# Patient Record
Sex: Female | Born: 1980 | Race: Black or African American | Hispanic: No | Marital: Single | Smoking: Never smoker
Health system: Southern US, Community
[De-identification: ages and names within clinical notes are randomized; demographics above are authoritative.]

## PROBLEM LIST (undated history)

## (undated) DIAGNOSIS — R0602 Shortness of breath: Secondary | ICD-10-CM

## (undated) DIAGNOSIS — R7303 Prediabetes: Secondary | ICD-10-CM

## (undated) DIAGNOSIS — K649 Unspecified hemorrhoids: Secondary | ICD-10-CM

## (undated) DIAGNOSIS — F988 Other specified behavioral and emotional disorders with onset usually occurring in childhood and adolescence: Secondary | ICD-10-CM

## (undated) DIAGNOSIS — R12 Heartburn: Secondary | ICD-10-CM

## (undated) DIAGNOSIS — Z91018 Allergy to other foods: Secondary | ICD-10-CM

## (undated) DIAGNOSIS — F32A Depression, unspecified: Secondary | ICD-10-CM

## (undated) DIAGNOSIS — Z8619 Personal history of other infectious and parasitic diseases: Secondary | ICD-10-CM

## (undated) DIAGNOSIS — R41840 Attention and concentration deficit: Secondary | ICD-10-CM

## (undated) DIAGNOSIS — L309 Dermatitis, unspecified: Secondary | ICD-10-CM

## (undated) DIAGNOSIS — F909 Attention-deficit hyperactivity disorder, unspecified type: Secondary | ICD-10-CM

## (undated) DIAGNOSIS — G4730 Sleep apnea, unspecified: Secondary | ICD-10-CM

## (undated) DIAGNOSIS — T7840XA Allergy, unspecified, initial encounter: Secondary | ICD-10-CM

## (undated) DIAGNOSIS — E7800 Pure hypercholesterolemia, unspecified: Secondary | ICD-10-CM

## (undated) DIAGNOSIS — F50819 Binge eating disorder, unspecified: Secondary | ICD-10-CM

## (undated) DIAGNOSIS — R0683 Snoring: Secondary | ICD-10-CM

## (undated) DIAGNOSIS — E669 Obesity, unspecified: Secondary | ICD-10-CM

## (undated) DIAGNOSIS — R7309 Other abnormal glucose: Secondary | ICD-10-CM

## (undated) HISTORY — DX: Depression, unspecified: F32.A

## (undated) HISTORY — DX: Snoring: R06.83

## (undated) HISTORY — DX: Prediabetes: R73.03

## (undated) HISTORY — DX: Pure hypercholesterolemia, unspecified: E78.00

## (undated) HISTORY — DX: Shortness of breath: R06.02

## (undated) HISTORY — DX: Binge eating disorder, unspecified: F50.819

## (undated) HISTORY — DX: Allergy, unspecified, initial encounter: T78.40XA

## (undated) HISTORY — DX: Attention-deficit hyperactivity disorder, unspecified type: F90.9

## (undated) HISTORY — DX: Other specified behavioral and emotional disorders with onset usually occurring in childhood and adolescence: F98.8

## (undated) HISTORY — DX: Other abnormal glucose: R73.09

## (undated) HISTORY — DX: Personal history of other infectious and parasitic diseases: Z86.19

## (undated) HISTORY — PX: TOOTH EXTRACTION: SUR596

## (undated) HISTORY — DX: Obesity, unspecified: E66.9

## (undated) HISTORY — DX: Allergy to other foods: Z91.018

## (undated) HISTORY — DX: Attention and concentration deficit: R41.840

## (undated) HISTORY — DX: Sleep apnea, unspecified: G47.30

## (undated) HISTORY — DX: Heartburn: R12

---
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
## 2000-10-08 ENCOUNTER — Other Ambulatory Visit: Admission: RE | Admit: 2000-10-08 | Discharge: 2000-10-08 | Payer: Self-pay | Admitting: Obstetrics and Gynecology

## 2001-10-09 ENCOUNTER — Other Ambulatory Visit: Admission: RE | Admit: 2001-10-09 | Discharge: 2001-10-09 | Payer: Self-pay | Admitting: Obstetrics and Gynecology

## 2004-10-22 DIAGNOSIS — Z8619 Personal history of other infectious and parasitic diseases: Secondary | ICD-10-CM

## 2004-10-22 HISTORY — DX: Personal history of other infectious and parasitic diseases: Z86.19

## 2005-12-18 ENCOUNTER — Other Ambulatory Visit: Admission: RE | Admit: 2005-12-18 | Discharge: 2005-12-18 | Payer: Self-pay | Admitting: Obstetrics and Gynecology

## 2011-09-29 ENCOUNTER — Ambulatory Visit: Payer: Managed Care, Other (non HMO)

## 2011-09-29 DIAGNOSIS — R509 Fever, unspecified: Secondary | ICD-10-CM

## 2011-09-29 DIAGNOSIS — R05 Cough: Secondary | ICD-10-CM

## 2011-09-29 DIAGNOSIS — J111 Influenza due to unidentified influenza virus with other respiratory manifestations: Secondary | ICD-10-CM

## 2013-12-25 ENCOUNTER — Ambulatory Visit (INDEPENDENT_AMBULATORY_CARE_PROVIDER_SITE_OTHER): Payer: BC Managed Care – PPO | Admitting: Family Medicine

## 2013-12-25 ENCOUNTER — Encounter: Payer: Self-pay | Admitting: Family Medicine

## 2013-12-25 VITALS — BP 128/62 | HR 75 | Temp 98.0°F | Ht 62.0 in | Wt 278.5 lb

## 2013-12-25 DIAGNOSIS — R7303 Prediabetes: Secondary | ICD-10-CM | POA: Insufficient documentation

## 2013-12-25 DIAGNOSIS — E669 Obesity, unspecified: Secondary | ICD-10-CM | POA: Insufficient documentation

## 2013-12-25 DIAGNOSIS — E119 Type 2 diabetes mellitus without complications: Secondary | ICD-10-CM

## 2013-12-25 LAB — COMPREHENSIVE METABOLIC PANEL
ALBUMIN: 3.6 g/dL (ref 3.5–5.2)
ALT: 13 U/L (ref 0–35)
AST: 15 U/L (ref 0–37)
Alkaline Phosphatase: 55 U/L (ref 39–117)
BUN: 9 mg/dL (ref 6–23)
CALCIUM: 9.3 mg/dL (ref 8.4–10.5)
CO2: 27 meq/L (ref 19–32)
Chloride: 106 mEq/L (ref 96–112)
Creatinine, Ser: 0.7 mg/dL (ref 0.4–1.2)
GFR: 115.62 mL/min (ref >60.00)
GLUCOSE: 80 mg/dL (ref 70–99)
POTASSIUM: 3.7 meq/L (ref 3.5–5.1)
SODIUM: 139 meq/L (ref 135–145)
TOTAL PROTEIN: 8 g/dL (ref 6.0–8.3)
Total Bilirubin: 0.5 mg/dL (ref 0.3–1.2)

## 2013-12-25 LAB — LIPID PANEL
Cholesterol: 220 mg/dL — ABNORMAL HIGH (ref 0–200)
HDL: 38.5 mg/dL — ABNORMAL LOW (ref >39.00)
LDL Cholesterol: 163 mg/dL — ABNORMAL HIGH (ref 0–99)
Total CHOL/HDL Ratio: 6
Triglycerides: 91 mg/dL (ref 0.0–149.0)
VLDL: 18.2 mg/dL (ref 0.0–40.0)

## 2013-12-25 LAB — HEMOGLOBIN A1C: HEMOGLOBIN A1C: 5.7 % (ref 4.6–6.5)

## 2013-12-25 NOTE — Progress Notes (Signed)
Pre visit review using our clinic review tool, if applicable. No additional management support is needed unless otherwise documented below in the visit note. 

## 2013-12-25 NOTE — Progress Notes (Signed)
BP 128/62  Pulse 75  Temp(Src) 98 F (36.7 C) (Oral)  Ht 5\' 2"  (1.575 m)  Wt 278 lb 8 oz (126.327 kg)  BMI 50.93 kg/m2  SpO2 98%  LMP 12/25/2013   CC: new pt to establish  Subjective:    Patient ID: Meghan Griffin, female    DOB: [birth year], 33 y.o.   MRN: [id number]  HPI: Meghan Griffin is a 33 y.o. female presenting on 12/25/2013 with Establish Care and disuss labs   Meghan Griffin presents today with her boyfriend to establish care.  Dr. Henderson Cloud OBGYN checked A1c 01/2013 and it was 7.0%.  Obesity - weight loss noted.  Using nutri-bullet.  15 lbs in last 3 months.   Wt Readings from Last 3 Encounters:  12/25/13 278 lb 8 oz (126.327 kg)    DM - regularly does not check sugars.  Denies hypoglycemic symptoms.  Some paresthesias of hands and feet. Last eye exam 04/2013.   She has had thyroid checked in past.  Preventative: Well woman exam - 01/2013 with Henderson Cloud. Normal pap LMP 12/25/2013  Lives with boyfriend, 1 dog Occupation: Fish farm manager Edu: [employer] Activity: no regular exercise Diet: good water, fruits/vegetables daily  Relevant past medical, surgical, family and social history reviewed and updated as indicated.  Allergies and medications reviewed and updated. No current outpatient prescriptions on file prior to visit.   No current facility-administered medications on file prior to visit.    Review of Systems  Constitutional: Negative for fever, chills, activity change, appetite change, fatigue and unexpected weight change.  HENT: Negative for hearing loss.   Eyes: Negative for visual disturbance.  Respiratory: Negative for cough, chest tightness, shortness of breath and wheezing.   Cardiovascular: Negative for chest pain, palpitations and leg swelling.  Gastrointestinal: Negative for nausea, vomiting, abdominal pain, diarrhea, constipation, blood in stool and abdominal distention.  Genitourinary: Negative for hematuria and difficulty urinating.  Musculoskeletal:  Negative for arthralgias, myalgias and neck pain.  Skin: Negative for rash.  Neurological: Negative for dizziness, seizures, syncope and headaches.  Hematological: Negative for adenopathy. Does not bruise/bleed easily.  Psychiatric/Behavioral: Negative for dysphoric mood. The patient is not nervous/anxious.    Per HPI unless specifically indicated above    Objective:    BP 128/62  Pulse 75  Temp(Src) 98 F (36.7 C) (Oral)  Ht 5\' 2"  (1.575 m)  Wt 278 lb 8 oz (126.327 kg)  BMI 50.93 kg/m2  SpO2 98%  LMP 12/25/2013  Physical Exam  Nursing note and vitals reviewed. Constitutional: She is oriented to person, place, and time. She appears well-developed and well-nourished. No distress.  HENT:  Head: Normocephalic and atraumatic.  Right Ear: Hearing, tympanic membrane, external ear and ear canal normal.  Left Ear: Hearing, tympanic membrane, external ear and ear canal normal.  Nose: Nose normal.  Mouth/Throat: Uvula is midline, oropharynx is clear and moist and mucous membranes are normal. No oropharyngeal exudate, posterior oropharyngeal edema or posterior oropharyngeal erythema.  Eyes: Conjunctivae and EOM are normal. Pupils are equal, round, and reactive to light. No scleral icterus.  Neck: Normal range of motion. Neck supple. No thyromegaly present.  Cardiovascular: Normal rate, regular rhythm, normal heart sounds and intact distal pulses.   No murmur heard. Pulses:      Radial pulses are 2+ on the right side, and 2+ on the left side.  Pulmonary/Chest: Effort normal and breath sounds normal. No respiratory distress. She has no wheezes. She has no rales.  Musculoskeletal: Normal range  of motion. She exhibits no edema.  Diabetic foot exam done  Lymphadenopathy:    She has no cervical adenopathy.  Neurological: She is alert and oriented to person, place, and time.  CN grossly intact, station and gait intact  Skin: Skin is warm and dry. No rash noted.  Hyperpigmented macule R neck -  birthmark  Psychiatric: She has a normal mood and affect. Her behavior is normal. Judgment and thought content normal.   No results found for this or any previous visit.    Assessment & Plan:   Problem List Items Addressed This Visit   Obesity     Chronic issue. Discussed weight loss strategies. Encouraged slowly incorporate activity into routine, with goal of 150min mod intensity aerobic exercise weekly. Pt working on healthy diet changes.    T2DM (type 2 diabetes mellitus) - Primary     Pathophysiology of disease discussed as well as treatment options. Discussed relation of diabetes and sugar control to weight. Check labwork today - if persistently A1c >6.8, will start metformin and refer for DMSE. Will also send in glucose meter. Pt agrees with plan. DM handout provided as well as ADA website.    Relevant Orders      Hemoglobin A1c      Lipid panel      Comprehensive metabolic panel      [facility] DIABETES FOOT EXAM (Completed)       Follow up plan: Return in about 3 months (around 03/27/2014), or as needed, for follow up.

## 2013-12-25 NOTE — Assessment & Plan Note (Signed)
Chronic issue. Discussed weight loss strategies. Encouraged slowly incorporate activity into routine, with goal of mod intensity aerobic exercise weekly. Pt working on healthy diet changes.

## 2013-12-25 NOTE — Assessment & Plan Note (Addendum)
Pathophysiology of disease discussed as well as treatment options. Discussed relation of diabetes and sugar control to weight. Check labwork today - if persistently A1c >6.8, will start metformin and refer for DMSE. Will also send in glucose meter. Pt agrees with plan. DM handout provided as well as ADA website.

## 2013-12-25 NOTE — Patient Instructions (Signed)
We will recheck labwork today. If sugar staying in diabetes range, we will refer you to diabetes education classes and start metformin.  I will also send a glucose meter to your pharmacy. Good to meet you today, call us with questions. Return to see me in 3 months for follow up. Congratulation on the weight! Keep it up!  Work on increased activity. Look into www.diabetes.org for more information.  Type 2 Diabetes Mellitus, Adult Type 2 diabetes mellitus, often simply referred to as type 2 diabetes, is a long-lasting (chronic) disease. In type 2 diabetes, the pancreas does not make enough insulin (a hormone), the cells are less responsive to the insulin that is made (insulin resistance), or both. Normally, insulin moves sugars from food into the tissue cells. The tissue cells use the sugars for energy. The lack of insulin or the lack of normal response to insulin causes excess sugars to build up in the blood instead of going into the tissue cells. As a result, high blood sugar (hyperglycemia) develops. The effect of high sugar (glucose) levels can cause many complications. Type 2 diabetes was also previously called adult-onset diabetes but it can occur at any age.  RISK FACTORS  A person is predisposed to developing type 2 diabetes if someone in the family has the disease and also has one or more of the following primary risk factors:  Overweight.  An inactive lifestyle.  A history of consistently eating high-calorie foods. Maintaining a normal weight and regular physical activity can reduce the chance of developing type 2 diabetes. SYMPTOMS  A person with type 2 diabetes may not show symptoms initially. The symptoms of type 2 diabetes appear slowly. The symptoms include:  Increased thirst (polydipsia).  Increased urination (polyuria).  Increased urination during the night (nocturia).  Weight loss. This weight loss may be rapid.  Frequent, recurring infections.  Tiredness  (fatigue).  Weakness.  Vision changes, such as blurred vision.  Fruity smell to your breath.  Abdominal pain.  Nausea or vomiting.  Cuts or bruises which are slow to heal.  Tingling or numbness in the hands or feet. DIAGNOSIS Type 2 diabetes is frequently not diagnosed until complications of diabetes are present. Type 2 diabetes is diagnosed when symptoms or complications are present and when blood glucose levels are increased. Your blood glucose level may be checked by one or more of the following blood tests:  A fasting blood glucose test. You will not be allowed to eat for at least 8 hours before a blood sample is taken.  A random blood glucose test. Your blood glucose is checked at any time of the day regardless of when you ate.  A hemoglobin A1c blood glucose test. A hemoglobin A1c test provides information about blood glucose control over the previous 3 months.  An oral glucose tolerance test (OGTT). Your blood glucose is measured after you have not eaten (fasted) for 2 hours and then after you drink a glucose-containing beverage. TREATMENT   You may need to take insulin or diabetes medicine daily to keep blood glucose levels in the desired range.  You will need to match insulin dosing with exercise and healthy food choices. The treatment goal is to maintain the before meal blood sugar (preprandial glucose) level at 70 130 mg/dL. HOME CARE INSTRUCTIONS   Have your hemoglobin A1c level checked twice a year.  Perform daily blood glucose monitoring as directed by your caregiver.  Monitor urine ketones when you are ill and as directed by your caregiver.  Take your diabetes medicine or insulin as directed by your caregiver to maintain your blood glucose levels in the desired range.  Never run out of diabetes medicine or insulin. It is needed every day.  Adjust insulin based on your intake of carbohydrates. Carbohydrates can raise blood glucose levels but need to be  included in your diet. Carbohydrates provide vitamins, minerals, and fiber which are an essential part of a healthy diet. Carbohydrates are found in fruits, vegetables, whole grains, dairy products, legumes, and foods containing added sugars.    Eat healthy foods. Alternate 3 meals with 3 snacks.  Lose weight if overweight.  Carry a medical alert card or wear your medical alert jewelry.  Carry a 15 gram carbohydrate snack with you at all times to treat low blood glucose (hypoglycemia). Some examples of 15 gram carbohydrate snacks include:  Glucose tablets, 3 or 4   Glucose gel, 15 gram tube  Raisins, 2 tablespoons (24 grams)  Jelly beans, 6  Animal crackers, 8  Regular pop, 4 ounces (120 mL)  Gummy treats, 9  Recognize hypoglycemia. Hypoglycemia occurs with blood glucose levels of 70 mg/dL and below. The risk for hypoglycemia increases when fasting or skipping meals, during or after intense exercise, and during sleep. Hypoglycemia symptoms can include:  Tremors or shakes.  Decreased ability to concentrate.  Sweating.  Increased heart rate.  Headache.  Dry mouth.  Hunger.  Irritability.  Anxiety.  Restless sleep.  Altered speech or coordination.  Confusion.  Treat hypoglycemia promptly. If you are alert and able to safely swallow, follow the 15:15 rule:  Take 15 20 grams of rapid-acting glucose or carbohydrate. Rapid-acting options include glucose gel, glucose tablets, or 4 ounces (120 mL) of fruit juice, regular soda, or low fat milk.  Check your blood glucose level 15 minutes after taking the glucose.  Take 15 20 grams more of glucose if the repeat blood glucose level is still 70 mg/dL or below.  Eat a meal or snack within 1 hour once blood glucose levels return to normal.    Be alert to polyuria and polydipsia which are early signs of hyperglycemia. An early awareness of hyperglycemia allows for prompt treatment. Treat hyperglycemia as directed by  your caregiver.  Engage in at least 150 minutes of moderate-intensity physical activity a week, spread over at least 3 days of the week or as directed by your caregiver. In addition, you should engage in resistance exercise at least 2 times a week or as directed by your caregiver.  Adjust your medicine and food intake as needed if you start a new exercise or sport.  Follow your sick day plan at any time you are unable to eat or drink as usual.  Avoid tobacco use.  Limit alcohol intake to no more than 1 drink per day for nonpregnant women and 2 drinks per day for men. You should drink alcohol only when you are also eating food. Talk with your caregiver whether alcohol is safe for you. Tell your caregiver if you drink alcohol several times a week.  Follow up with your caregiver regularly.  Schedule an eye exam soon after the diagnosis of type 2 diabetes and then annually.  Perform daily skin and foot care. Examine your skin and feet daily for cuts, bruises, redness, nail problems, bleeding, blisters, or sores. A foot exam by a caregiver should be done annually.  Brush your teeth and gums at least twice a day and floss at least once a day. Follow up  with your dentist regularly.  Share your diabetes management plan with your workplace or school.  Stay up-to-date with immunizations.  Learn to manage stress.  Obtain ongoing diabetes education and support as needed.  Participate in, or seek rehabilitation as needed to maintain or improve independence and quality of life. Request a physical or occupational therapy referral if you are having foot or hand numbness or difficulties with grooming, dressing, eating, or physical activity. SEEK MEDICAL CARE IF:   You are unable to eat food or drink fluids for more than 6 hours.  You have nausea and vomiting for more than 6 hours.  Your blood glucose level is over 240 mg/dL.  There is a change in mental status.  You develop an additional  serious illness.  You have diarrhea for more than 6 hours.  You have been sick or have had a fever for a couple of days and are not getting better.  You have pain during any physical activity.  SEEK IMMEDIATE MEDICAL CARE IF:  You have difficulty breathing.  You have moderate to large ketone levels. MAKE SURE YOU:  Understand these instructions.  Will watch your condition.  Will get help right away if you are not doing well or get worse. Document Released: 10/08/2005 Document Revised: 07/02/2012 Document Reviewed: 05/06/2012 [facility] Patient Information 2014 [place], [place].

## 2013-12-26 ENCOUNTER — Encounter: Payer: Self-pay | Admitting: Family Medicine

## 2013-12-28 ENCOUNTER — Encounter: Payer: Self-pay | Admitting: *Deleted

## 2013-12-29 ENCOUNTER — Telehealth: Payer: Self-pay

## 2013-12-29 NOTE — Telephone Encounter (Signed)
Relevant patient education assigned to patient using Emmi. ° °

## 2014-06-01 LAB — OB RESULTS CONSOLE GC/CHLAMYDIA
CHLAMYDIA, DNA PROBE: NEGATIVE
GC PROBE AMP, GENITAL: NEGATIVE

## 2014-06-01 LAB — OB RESULTS CONSOLE HEPATITIS B SURFACE ANTIGEN: Hepatitis B Surface Ag: NEGATIVE

## 2014-06-01 LAB — OB RESULTS CONSOLE ANTIBODY SCREEN: ANTIBODY SCREEN: NEGATIVE

## 2014-06-01 LAB — OB RESULTS CONSOLE ABO/RH: RH Type: POSITIVE

## 2014-06-01 LAB — OB RESULTS CONSOLE HIV ANTIBODY (ROUTINE TESTING): HIV: NONREACTIVE

## 2014-06-01 LAB — OB RESULTS CONSOLE RUBELLA ANTIBODY, IGM: Rubella: IMMUNE

## 2014-07-21 ENCOUNTER — Other Ambulatory Visit (HOSPITAL_COMMUNITY): Payer: Self-pay | Admitting: Obstetrics and Gynecology

## 2014-07-21 DIAGNOSIS — Z3689 Encounter for other specified antenatal screening: Secondary | ICD-10-CM

## 2014-07-28 ENCOUNTER — Ambulatory Visit (HOSPITAL_COMMUNITY): Payer: BC Managed Care – PPO

## 2014-08-06 ENCOUNTER — Ambulatory Visit (HOSPITAL_COMMUNITY)
Admission: RE | Admit: 2014-08-06 | Discharge: 2014-08-06 | Disposition: A | Discharge status: Home / Self Care | Payer: BC Managed Care – PPO | Source: Ambulatory Visit | Attending: Obstetrics and Gynecology | Admitting: Obstetrics and Gynecology

## 2014-08-06 DIAGNOSIS — Z36 Encounter for antenatal screening of mother: Secondary | ICD-10-CM | POA: Insufficient documentation

## 2014-08-06 DIAGNOSIS — O99212 Obesity complicating pregnancy, second trimester: Secondary | ICD-10-CM | POA: Diagnosis present

## 2014-08-06 DIAGNOSIS — Z3689 Encounter for other specified antenatal screening: Secondary | ICD-10-CM

## 2014-08-06 DIAGNOSIS — Z3A22 22 weeks gestation of pregnancy: Secondary | ICD-10-CM | POA: Diagnosis not present

## 2014-08-26 ENCOUNTER — Other Ambulatory Visit (HOSPITAL_COMMUNITY): Payer: Self-pay | Admitting: Obstetrics and Gynecology

## 2014-08-26 DIAGNOSIS — O99213 Obesity complicating pregnancy, third trimester: Secondary | ICD-10-CM

## 2014-08-26 DIAGNOSIS — Z3A29 29 weeks gestation of pregnancy: Secondary | ICD-10-CM

## 2014-09-20 LAB — OB RESULTS CONSOLE RPR: RPR: NONREACTIVE

## 2014-09-22 ENCOUNTER — Ambulatory Visit (HOSPITAL_COMMUNITY): Payer: BC Managed Care – PPO

## 2014-09-24 ENCOUNTER — Ambulatory Visit (HOSPITAL_COMMUNITY)
Admission: RE | Admit: 2014-09-24 | Discharge: 2014-09-24 | Disposition: A | Discharge status: Home / Self Care | Payer: BC Managed Care – PPO | Source: Ambulatory Visit | Attending: Obstetrics and Gynecology | Admitting: Obstetrics and Gynecology

## 2014-09-24 ENCOUNTER — Encounter (HOSPITAL_COMMUNITY): Payer: Self-pay | Admitting: Obstetrics and Gynecology

## 2014-09-24 ENCOUNTER — Other Ambulatory Visit (HOSPITAL_COMMUNITY): Payer: Self-pay | Admitting: Obstetrics and Gynecology

## 2014-09-24 ENCOUNTER — Encounter (HOSPITAL_COMMUNITY): Payer: Self-pay

## 2014-09-24 DIAGNOSIS — Z36 Encounter for antenatal screening of mother: Secondary | ICD-10-CM | POA: Insufficient documentation

## 2014-09-24 DIAGNOSIS — O99213 Obesity complicating pregnancy, third trimester: Secondary | ICD-10-CM

## 2014-09-24 DIAGNOSIS — Z3689 Encounter for other specified antenatal screening: Secondary | ICD-10-CM | POA: Insufficient documentation

## 2014-09-24 DIAGNOSIS — Z3A29 29 weeks gestation of pregnancy: Secondary | ICD-10-CM | POA: Insufficient documentation

## 2014-09-24 DIAGNOSIS — O99210 Obesity complicating pregnancy, unspecified trimester: Secondary | ICD-10-CM | POA: Insufficient documentation

## 2014-09-24 HISTORY — DX: Dermatitis, unspecified: L30.9

## 2014-09-24 HISTORY — DX: Unspecified hemorrhoids: K64.9

## 2014-09-24 LAB — US OB FOLLOW UP

## 2014-10-05 ENCOUNTER — Other Ambulatory Visit (HOSPITAL_COMMUNITY): Payer: Self-pay

## 2014-10-22 NOTE — L&D Delivery Note (Signed)
Cesarean Section Procedure Note  Indications: 34 yo G1P0 at 38 weeks 5 days with Non-reassuring fetal heart tracing Cat III  Pre-operative Diagnosis: Non-Reassuring Fetal Heart Tracing  Post-operative Diagnosis: same  Surgeon: Crit Obremski [name]   Assistants: Dr. Leggett  Anesthesia: Epidural anesthesia  ASA Class: 2  Procedure Details   The patient was counseled about the risks, benefits, complications of the cesarean section using an interpreter. The patient concurred with the proposed plan, giving informed consent.  The site of surgery properly noted/marked. The patient was taken to Operating Room # 1, identified as Lenox Junkin and the procedure verified as C-Section Delivery. A Time Out was held and the above information confirmed.  After epidural anesthesia was found to be adequate, the patient was placed in the dorsal supine position with a leftward tilt, the fetal heart beat was found in the 80's 90's. The abdomen was prepped and draped. A Pfannenstiel incision was made and carried down through the subcutaneous tissue to the fascia.  The fascia was incised in the midline and the fascial incision was opened and extended laterally followed by opening the abdominal peritoneum bluntly using surgeons fingers. The Alexis retractor was then deployed. The scalpel was then used to make a low transverse incision on the uterus which was extended laterally with  blunt dissection. The fetal vertex was identified, and the head delivered with assistance with pushing on the uterine fundus.  The head was then delivered easily through the uterine incision followed by the body. The A live female infant was bulb suctioned on the operative field cried vigorously, cord was clamped and cut and the infant was passed to the waiting neonatologist. Apgars 8/9. Placenta was then delivered spontaneously, intact and appeared to possibly have a fundal abruption, the uterus was cleared of all clot and debris. The  uterine incision was repaired with #0 Monocryl in running locked fashion. A second imbricating suture was performed using the same suture. The incision was hemostatic. Ovaries and tubes were inspected and normal. The Alexis retractor was removed. The abdominal cavity was cleared of all clot and debris. The abdominal peritoneum was reapproximated with 2-0 chromic  in a running fashion, the rectus muscles was reapproximated with #2 chromic in interrupted fashion. The fascia was closed with 0 Vicryl in a running fashion. The subcuticular layer was irrigated and all bleeders cauterized.  The subcutaneous layer was re-approximated with 2-0 plain gut.   The skin was closed with 4-0 vicryl in a subcuticular fashion using a Keith needle. The incision was dressed with benzoine, steri strips and pressure dressing. All sponge lap and needle counts were correct x3. Patient tolerated the procedure well and recovered in stable condition following the procedure.  Instrument, sponge, and needle counts were correct prior the abdominal closure and at the conclusion of the case.   Findings: Live female infant, Apgars 8/9, clear amniotic fluid, normal appearing placenta, normal uterus, bilateral tubes and ovaries  Estimated Blood Loss: 750 mL         Drains: Foley catheter         Specimens: Placenta to L&D         Implants: none         Complications:  None; patient tolerated the procedure well.         Disposition: PACU - hemodynamically stable.   Rosina Cressler [name]  

## 2014-10-29 ENCOUNTER — Ambulatory Visit (HOSPITAL_COMMUNITY)
Admission: RE | Admit: 2014-10-29 | Discharge: 2014-10-29 | Disposition: A | Discharge status: Home / Self Care | Payer: BLUE CROSS/BLUE SHIELD | Source: Ambulatory Visit | Attending: Obstetrics and Gynecology | Admitting: Obstetrics and Gynecology

## 2014-10-29 ENCOUNTER — Encounter (HOSPITAL_COMMUNITY): Payer: Self-pay

## 2014-10-29 DIAGNOSIS — E669 Obesity, unspecified: Secondary | ICD-10-CM | POA: Diagnosis not present

## 2014-10-29 DIAGNOSIS — O99213 Obesity complicating pregnancy, third trimester: Secondary | ICD-10-CM | POA: Insufficient documentation

## 2014-11-10 LAB — OB RESULTS CONSOLE GBS: GBS: POSITIVE

## 2014-11-28 ENCOUNTER — Inpatient Hospital Stay (HOSPITAL_COMMUNITY)
Admission: AD | Admit: 2014-11-28 | Discharge: 2014-11-28 | Disposition: A | Discharge status: Home / Self Care | Payer: BLUE CROSS/BLUE SHIELD | Source: Ambulatory Visit | Attending: Obstetrics | Admitting: Obstetrics

## 2014-11-28 ENCOUNTER — Encounter (HOSPITAL_COMMUNITY): Payer: Self-pay | Admitting: *Deleted

## 2014-11-28 DIAGNOSIS — O4693 Antepartum hemorrhage, unspecified, third trimester: Secondary | ICD-10-CM | POA: Insufficient documentation

## 2014-11-28 DIAGNOSIS — O2203 Varicose veins of lower extremity in pregnancy, third trimester: Secondary | ICD-10-CM | POA: Diagnosis not present

## 2014-11-28 DIAGNOSIS — Z3A38 38 weeks gestation of pregnancy: Secondary | ICD-10-CM | POA: Diagnosis not present

## 2014-11-28 DIAGNOSIS — O2200 Varicose veins of lower extremity in pregnancy, unspecified trimester: Secondary | ICD-10-CM | POA: Diagnosis not present

## 2014-11-28 LAB — URINE MICROSCOPIC-ADD ON

## 2014-11-28 LAB — URINALYSIS, ROUTINE W REFLEX MICROSCOPIC
Bilirubin Urine: NEGATIVE
GLUCOSE, UA: NEGATIVE mg/dL
KETONES UR: NEGATIVE mg/dL
Leukocytes, UA: NEGATIVE
NITRITE: NEGATIVE
PH: 5.5 (ref 5.0–8.0)
Protein, ur: NEGATIVE mg/dL
Specific Gravity, Urine: 1.01 (ref 1.005–1.030)
Urobilinogen, UA: 0.2 mg/dL (ref 0.0–1.0)

## 2014-11-28 MED ORDER — FERRIC SUBSULFATE 259 MG/GM EX SOLN
Freq: Once | CUTANEOUS | Status: AC
Start: 2014-11-28 — End: 2014-11-28
  Administered 2014-11-28: 1 via TOPICAL
  Filled 2014-11-28: qty 8

## 2014-11-28 NOTE — MAU Note (Signed)
Went to [facility] ago and saw bright red on tissue when wiped. Few small clots. No pain. Baby moving.

## 2014-11-28 NOTE — MAU Provider Note (Signed)
History     CSN: [id number]  Arrival date and time: 11/28/14 0038   None     Chief Complaint  Patient presents with  . Vaginal Bleeding   HPI Comments: Meghan Griffin 34 y.o. G1P0 [redacted]w[redacted]d presents to MAU with vaginal bleeding that she noticed in shower. The blood ran down her legs. She denies any pain, LOF, Contractions. She has noticed swelling in feet and hands. She has an appointment on Tuesday  Vaginal Bleeding      Past Medical History  Diagnosis Date  . History of gonorrhea 2006    treated  . Obesity   . Eczema   . Hemorrhoids     Past Surgical History  Procedure Laterality Date  . Tooth extraction      Family History  Problem Relation Age of Onset  . Alcohol abuse Father   . COPD Father   . Hyperlipidemia Father   . CAD Father 84    MI  . Stroke Father 107  . Hypertension Father   . Diabetes Father   . Hyperlipidemia Maternal Grandmother   . Hypertension Maternal Grandmother   . Hyperlipidemia Maternal Grandfather   . Hypertension Maternal Grandfather   . Cancer Other     great grandmother - bone marrow  . Cancer Paternal Aunt     leukemia    History  Substance Use Topics  . Smoking status: Never Smoker   . Smokeless tobacco: Never Used  . Alcohol Use: No     Comment: occasional    Allergies:  Allergies  Allergen Reactions  . Amoxicillin Diarrhea    Prescriptions prior to admission  Medication Sig Dispense Refill Last Dose  . ibuprofen (ADVIL,MOTRIN) 800 MG tablet Take 800 mg by mouth every 8 (eight) hours as needed.   Not Taking  . meclizine (ANTIVERT) 25 MG tablet Take 25 mg by mouth as needed for dizziness.   Not Taking  . Multiple Vitamin (MULTIVITAMIN) tablet Take 1 tablet by mouth daily.   Not Taking  . norgestimate-ethinyl estradiol (ORTHO-CYCLEN,SPRINTEC,PREVIFEM) 0.25-35 MG-MCG tablet Take 1 tablet by mouth daily.   Not Taking  . nystatin (MYCOSTATIN/NYSTOP) 100000 UNIT/GM POWD Apply topically 2 (two) times daily as needed.    Not Taking  . Prenatal Vit-Fe Fumarate-FA (PRENATAL VITAMIN PO) Take by mouth.   Taking    Review of Systems  Constitutional: Negative.   HENT: Negative.   Eyes: Negative.   Respiratory: Negative.   Cardiovascular: Positive for leg swelling.  Gastrointestinal: Negative.   Genitourinary:       Vaginal bleeding  Skin: Negative.   Neurological: Negative.   Psychiatric/Behavioral: Negative.    Physical Exam   Blood pressure 136/70, pulse 98, temperature 98.5 F (36.9 C), temperature source Oral, resp. rate 20, height  (1.575 m), weight 128.005 kg (282 lb 3.2 oz), last menstrual period 02/23/2014.  Physical Exam  Constitutional: She is oriented to person, place, and time. She appears well-developed and well-nourished. No distress.  obese  HENT:  Head: Normocephalic and atraumatic.  Eyes: Pupils are equal, round, and reactive to light.  GI: Soft. She exhibits no distension. There is no tenderness. There is no rebound and no guarding.  Genitourinary:  Genital:externally there is blood. After cleaning she has a small varicose vein that has ruptured. Monsels solution used to stop bleeeding Vaginal: No Blood/ small amount white discharge Cervix: closed/ thick    Musculoskeletal: She exhibits edema.  +3 edema in legs  Neurological: She is alert and oriented  to person, place, and time.  Skin: Skin is warm and dry.  Psychiatric: She has a normal mood and affect. Her behavior is normal. Judgment and thought content normal.   TOCO: rate 130 reassuring/ no contractions  Results for orders placed or performed during the hospital encounter of 11/28/14 (from the past 24 hour(s))  Urinalysis, Routine w reflex microscopic     Status: Abnormal   Collection Time: 11/28/14  1:00 AM  Result Value Ref Range   Color, Urine YELLOW YELLOW   APPearance CLEAR CLEAR   Specific Gravity, Urine 1.010 1.005 - 1.030   pH 5.5 5.0 - 8.0   Glucose, UA NEGATIVE NEGATIVE mg/dL   Hgb urine dipstick  LARGE (A) NEGATIVE   Bilirubin Urine NEGATIVE NEGATIVE   Ketones, ur NEGATIVE NEGATIVE mg/dL   Protein, ur NEGATIVE NEGATIVE mg/dL   Urobilinogen, UA 0.2 0.0 - 1.0 mg/dL   Nitrite NEGATIVE NEGATIVE   Leukocytes, UA NEGATIVE NEGATIVE  Urine microscopic-add on     Status: Abnormal   Collection Time: 11/28/14  1:00 AM  Result Value Ref Range   Squamous Epithelial / LPF RARE RARE   WBC, UA 0-2 <3 WBC/hpf   RBC / HPF 21-50 <3 RBC/hpf   Bacteria, UA FEW (A) RARE     MAU Course  Procedures  MDM Spoke with Dr Chestine Sporelark who advised it was ok to send her home BP was elevated on initial contact due to size small cuff/ after large cuff used BP was normal ranges Assessment and Plan   A: Varicose veins in pregnancy  P: Advised to purchase maternity belt, sit as often as possible Reassurance/ Follow with Dr Henderson CloudHorvath/ return to MAU as needed  Meghan Griffin, Meghan Griffin 11/28/2014, 2:17 AM

## 2014-11-28 NOTE — MAU Note (Signed)
Patient reports seeing bright red bleeding after wiping at 11:30pm. Denies intercourse. Patient reports bright red bleeding continued, noticed blood running down her leg in the shower. Patient reports normal fetal movement, no LOF noted by patient. Denies UCs.

## 2014-11-28 NOTE — Discharge Instructions (Signed)
Varicose Veins Varicose veins are veins that have become enlarged and twisted. CAUSES This condition is the result of valves in the veins not working properly. Valves in the veins help return blood from the leg to the heart. If these valves are damaged, blood flows backwards and backs up into the veins in the leg near the skin. This causes the veins to become larger. People who are on their feet a lot, who are pregnant, or who are overweight are more likely to develop varicose veins. SYMPTOMS   Bulging, twisted-appearing, bluish veins, most commonly found on the legs.  Leg pain or a feeling of heaviness. These symptoms may be worse at the end of the day.  Leg swelling.  Skin color changes. DIAGNOSIS  Varicose veins can usually be diagnosed with an exam of your legs by your caregiver. He or she may recommend an ultrasound of your leg veins. TREATMENT  Most varicose veins can be treated at home.However, other treatments are available for people who have persistent symptoms or who want to treat the cosmetic appearance of the varicose veins. These include:  Laser treatment of very small varicose veins.  Medicine that is shot (injected) into the vein. This medicine hardens the walls of the vein and closes off the vein. This treatment is called sclerotherapy. Afterwards, you may need to wear clothing or bandages that apply pressure.  Surgery. HOME CARE INSTRUCTIONS   Do not stand or sit in one position for long periods of time. Do not sit with your legs crossed. Rest with your legs raised during the day.  Wear elastic stockings or support hose. Do not wear other tight, encircling garments around the legs, pelvis, or waist.  Walk as much as possible to increase blood flow.  Raise the foot of your bed at night with 2-inch blocks.  If you get a cut in the skin over the vein and the vein bleeds, lie down with your leg raised and press on it with a clean cloth until the bleeding stops. Then  place a bandage (dressing) on the cut. See your caregiver if it continues to bleed or needs stitches. SEEK MEDICAL CARE IF:   The skin around your ankle starts to break down.  You have pain, redness, tenderness, or hard swelling developing in your leg over a vein.  You are uncomfortable due to leg pain. Document Released: 07/18/2005 Document Revised: 12/31/2011 Document Reviewed: 12/04/2010 [facility] Patient Information 2015 [place], [place]. This information is not intended to replace advice given to you by your health care provider. Make sure you discuss any questions you have with your health care provider. Third Trimester of Pregnancy The third trimester is from week 29 through week 42, months 7 through 9. The third trimester is a time when the fetus is growing rapidly. At the end of the ninth month, the fetus is about 20 inches in length and weighs 6-10 pounds.  BODY CHANGES Your body goes through many changes during pregnancy. The changes vary from woman to woman.   Your weight will continue to increase. You can expect to gain 25-35 pounds (11-16 kg) by the end of the pregnancy.  You may begin to get stretch marks on your hips, abdomen, and breasts.  You may urinate more often because the fetus is moving lower into your pelvis and pressing on your bladder.  You may develop or continue to have heartburn as a result of your pregnancy.  You may develop constipation because certain hormones are causing the  muscles that push waste through your intestines to slow down.  You may develop hemorrhoids or swollen, bulging veins (varicose veins).  You may have pelvic pain because of the weight gain and pregnancy hormones relaxing your joints between the bones in your pelvis. Backaches may result from overexertion of the muscles supporting your posture.  You may have changes in your hair. These can include thickening of your hair, rapid growth, and changes in texture. Some women also have hair  loss during or after pregnancy, or hair that feels dry or thin. Your hair will most likely return to normal after your baby is born.  Your breasts will continue to grow and be tender. A yellow discharge may leak from your breasts called colostrum.  Your belly button may stick out.  You may feel short of breath because of your expanding uterus.  You may notice the fetus "dropping," or moving lower in your abdomen.  You may have a bloody mucus discharge. This usually occurs a few days to a week before labor begins.  Your cervix becomes thin and soft (effaced) near your due date. WHAT TO EXPECT AT YOUR PRENATAL EXAMS  You will have prenatal exams every 2 weeks until week 36. Then, you will have weekly prenatal exams. During a routine prenatal visit:  You will be weighed to make sure you and the fetus are growing normally.  Your blood pressure is taken.  Your abdomen will be measured to track your baby's growth.  The fetal heartbeat will be listened to.  Any test results from the previous visit will be discussed.  You may have a cervical check near your due date to see if you have effaced. At around 36 weeks, your caregiver will check your cervix. At the same time, your caregiver will also perform a test on the secretions of the vaginal tissue. This test is to determine if a type of bacteria, Group B streptococcus, is present. Your caregiver will explain this further. Your caregiver may ask you:  What your birth plan is.  How you are feeling.  If you are feeling the baby move.  If you have had any abnormal symptoms, such as leaking fluid, bleeding, severe headaches, or abdominal cramping.  If you have any questions. Other tests or screenings that may be performed during your third trimester include:  Blood tests that check for low iron levels (anemia).  Fetal testing to check the health, activity level, and growth of the fetus. Testing is done if you have certain medical  conditions or if there are problems during the pregnancy. FALSE LABOR You may feel small, irregular contractions that eventually go away. These are called Braxton Hicks contractions, or false labor. Contractions may last for hours, days, or even weeks before true labor sets in. If contractions come at regular intervals, intensify, or become painful, it is best to be seen by your caregiver.  SIGNS OF LABOR   Menstrual-like cramps.  Contractions that are 5 minutes apart or less.  Contractions that start on the top of the uterus and spread down to the lower abdomen and back.  A sense of increased pelvic pressure or back pain.  A watery or bloody mucus discharge that comes from the vagina. If you have any of these signs before the 37th week of pregnancy, call your caregiver right away. You need to go to the hospital to get checked immediately. HOME CARE INSTRUCTIONS   Avoid all smoking, herbs, alcohol, and unprescribed drugs. These chemicals affect the  formation and growth of the baby.  Follow your caregiver's instructions regarding medicine use. There are medicines that are either safe or unsafe to take during pregnancy.  Exercise only as directed by your caregiver. Experiencing uterine cramps is a good sign to stop exercising.  Continue to eat regular, healthy meals.  Wear a good support bra for breast tenderness.  Do not use hot tubs, steam rooms, or saunas.  Wear your seat belt at all times when driving.  Avoid raw meat, uncooked cheese, cat litter boxes, and soil used by cats. These carry germs that can cause birth defects in the baby.  Take your prenatal vitamins.  Try taking a stool softener (if your caregiver approves) if you develop constipation. Eat more high-fiber foods, such as fresh vegetables or fruit and whole grains. Drink plenty of fluids to keep your urine clear or pale yellow.  Take warm sitz baths to soothe any pain or discomfort caused by hemorrhoids. Use  hemorrhoid cream if your caregiver approves.  If you develop varicose veins, wear support hose. Elevate your feet for 15 minutes, 3-4 times a day. Limit salt in your diet.  Avoid heavy lifting, wear low heal shoes, and practice good posture.  Rest a lot with your legs elevated if you have leg cramps or low back pain.  Visit your dentist if you have not gone during your pregnancy. Use a soft toothbrush to brush your teeth and be gentle when you floss.  A sexual relationship may be continued unless your caregiver directs you otherwise.  Do not travel far distances unless it is absolutely necessary and only with the approval of your caregiver.  Take prenatal classes to understand, practice, and ask questions about the labor and delivery.  Make a trial run to the hospital.  Pack your hospital bag.  Prepare the baby's nursery.  Continue to go to all your prenatal visits as directed by your caregiver. SEEK MEDICAL CARE IF:  You are unsure if you are in labor or if your water has broken.  You have dizziness.  You have mild pelvic cramps, pelvic pressure, or nagging pain in your abdominal area.  You have persistent nausea, vomiting, or diarrhea.  You have a bad smelling vaginal discharge.  You have pain with urination. SEEK IMMEDIATE MEDICAL CARE IF:   You have a fever.  You are leaking fluid from your vagina.  You have spotting or bleeding from your vagina.  You have severe abdominal cramping or pain.  You have rapid weight loss or gain.  You have shortness of breath with chest pain.  You notice sudden or extreme swelling of your face, hands, ankles, feet, or legs.  You have not felt your baby move in over an hour.  You have severe headaches that do not go away with medicine.  You have vision changes. Document Released: 10/02/2001 Document Revised: 10/13/2013 Document Reviewed: 12/09/2012 [facility] Patient Information 2015 [place]. This information is not  intended to replace advice given to you by your health care provider. Make sure you discuss any questions you have with your health care provider.

## 2014-11-29 ENCOUNTER — Inpatient Hospital Stay (HOSPITAL_COMMUNITY): Payer: BLUE CROSS/BLUE SHIELD

## 2014-11-29 ENCOUNTER — Encounter (HOSPITAL_COMMUNITY): Payer: Self-pay | Admitting: *Deleted

## 2014-11-29 ENCOUNTER — Inpatient Hospital Stay (HOSPITAL_COMMUNITY): Payer: BLUE CROSS/BLUE SHIELD | Admitting: Anesthesiology

## 2014-11-29 ENCOUNTER — Inpatient Hospital Stay (HOSPITAL_COMMUNITY)
Admission: AD | Admit: 2014-11-29 | Discharge: 2014-12-02 | DRG: 765 | Disposition: A | Discharge status: Home / Self Care | Payer: BLUE CROSS/BLUE SHIELD | Source: Ambulatory Visit | Attending: Obstetrics & Gynecology | Admitting: Obstetrics & Gynecology

## 2014-11-29 DIAGNOSIS — Z6841 Body Mass Index (BMI) 40.0 and over, adult: Secondary | ICD-10-CM

## 2014-11-29 DIAGNOSIS — O99214 Obesity complicating childbirth: Secondary | ICD-10-CM | POA: Diagnosis present

## 2014-11-29 DIAGNOSIS — Z98891 History of uterine scar from previous surgery: Secondary | ICD-10-CM

## 2014-11-29 DIAGNOSIS — Z3A38 38 weeks gestation of pregnancy: Secondary | ICD-10-CM | POA: Insufficient documentation

## 2014-11-29 DIAGNOSIS — E669 Obesity, unspecified: Secondary | ICD-10-CM | POA: Diagnosis present

## 2014-11-29 DIAGNOSIS — IMO0001 Reserved for inherently not codable concepts without codable children: Secondary | ICD-10-CM

## 2014-11-29 DIAGNOSIS — N858 Other specified noninflammatory disorders of uterus: Secondary | ICD-10-CM | POA: Diagnosis present

## 2014-11-29 DIAGNOSIS — O99824 Streptococcus B carrier state complicating childbirth: Secondary | ICD-10-CM | POA: Diagnosis present

## 2014-11-29 DIAGNOSIS — O4100X0 Oligohydramnios, unspecified trimester, not applicable or unspecified: Secondary | ICD-10-CM | POA: Insufficient documentation

## 2014-11-29 LAB — CBC
HEMATOCRIT: 37.5 % (ref 36.0–46.0)
Hemoglobin: 12.9 g/dL (ref 12.0–15.0)
MCH: 30.9 pg (ref 26.0–34.0)
MCHC: 34.4 g/dL (ref 30.0–36.0)
MCV: 89.9 fL (ref 78.0–100.0)
Platelets: 127 10*3/uL — ABNORMAL LOW (ref 150–400)
RBC: 4.17 MIL/uL (ref 3.87–5.11)
RDW: 14.1 % (ref 11.5–15.5)
WBC: 9.4 10*3/uL (ref 4.0–10.5)

## 2014-11-29 LAB — TYPE AND SCREEN
ABO/RH(D): O POS
ANTIBODY SCREEN: NEGATIVE

## 2014-11-29 LAB — URINALYSIS, ROUTINE W REFLEX MICROSCOPIC
Bilirubin Urine: NEGATIVE
GLUCOSE, UA: NEGATIVE mg/dL
HGB URINE DIPSTICK: NEGATIVE
Ketones, ur: 15 mg/dL — AB
Leukocytes, UA: NEGATIVE
Nitrite: NEGATIVE
PH: 6 (ref 5.0–8.0)
Protein, ur: NEGATIVE mg/dL
SPECIFIC GRAVITY, URINE: 1.01 (ref 1.005–1.030)
Urobilinogen, UA: 0.2 mg/dL (ref 0.0–1.0)

## 2014-11-29 MED ORDER — EPHEDRINE 5 MG/ML INJ: 10.0000 mg | INTRAVENOUS | Status: DC | PRN

## 2014-11-29 MED ORDER — ACETAMINOPHEN 325 MG PO TABS
650.0000 mg | ORAL_TABLET | ORAL | Status: DC | PRN
Start: 2014-11-29 — End: 2014-11-30

## 2014-11-29 MED ORDER — PHENYLEPHRINE 40 MCG/ML (10ML) SYRINGE FOR IV PUSH (FOR BLOOD PRESSURE SUPPORT)
80.0000 ug | PREFILLED_SYRINGE | INTRAVENOUS | Status: AC | PRN
  Administered 2014-11-29 (×2): 40 ug via INTRAVENOUS
  Administered 2014-11-29: 80 ug via INTRAVENOUS

## 2014-11-29 MED ORDER — PENICILLIN G POTASSIUM 5000000 UNITS IJ SOLR
5.0000 10*6.[IU] | Freq: Once | INTRAMUSCULAR | Status: AC
  Administered 2014-11-29: 5 10*6.[IU] via INTRAVENOUS
  Filled 2014-11-29: qty 5

## 2014-11-29 MED ORDER — PHENYLEPHRINE 40 MCG/ML (10ML) SYRINGE FOR IV PUSH (FOR BLOOD PRESSURE SUPPORT)
80.0000 ug | PREFILLED_SYRINGE | INTRAVENOUS | Status: DC | PRN
  Administered 2014-11-29: 80 ug via INTRAVENOUS
  Filled 2014-11-29 (×2): qty 20

## 2014-11-29 MED ORDER — LACTATED RINGERS IV SOLN: 500.0000 mL | Freq: Once | INTRAVENOUS | Status: DC

## 2014-11-29 MED ORDER — OXYTOCIN BOLUS FROM INFUSION: 500.0000 mL | INTRAVENOUS | Status: DC

## 2014-11-29 MED ORDER — FENTANYL 2.5 MCG/ML BUPIVACAINE 1/10 % EPIDURAL INFUSION (WH - ANES)
14.0000 mL/h | INTRAMUSCULAR | Status: DC | PRN
  Filled 2014-11-29: qty 125

## 2014-11-29 MED ORDER — PENICILLIN G POTASSIUM 5000000 UNITS IJ SOLR
2.5000 10*6.[IU] | INTRAVENOUS | Status: DC
  Filled 2014-11-29 (×4): qty 2.5

## 2014-11-29 MED ORDER — FENTANYL 2.5 MCG/ML BUPIVACAINE 1/10 % EPIDURAL INFUSION (WH - ANES)
INTRAMUSCULAR | Status: DC | PRN
  Administered 2014-11-29: 14 mL/h via EPIDURAL

## 2014-11-29 MED ORDER — LACTATED RINGERS IV SOLN: INTRAVENOUS | Status: DC

## 2014-11-29 MED ORDER — DIPHENHYDRAMINE HCL 50 MG/ML IJ SOLN: 12.5000 mg | INTRAMUSCULAR | Status: DC | PRN

## 2014-11-29 MED ORDER — BUTORPHANOL TARTRATE 1 MG/ML IJ SOLN
1.0000 mg | Freq: Once | INTRAMUSCULAR | Status: AC
  Administered 2014-11-29: 1 mg via INTRAMUSCULAR
  Filled 2014-11-29: qty 1

## 2014-11-29 MED ORDER — OXYCODONE-ACETAMINOPHEN 5-325 MG PO TABS: 1.0000 | ORAL_TABLET | ORAL | Status: DC | PRN

## 2014-11-29 MED ORDER — CITRIC ACID-SODIUM CITRATE 334-500 MG/5ML PO SOLN
30.0000 mL | ORAL | Status: DC | PRN
  Administered 2014-11-30: 30 mL via ORAL
  Filled 2014-11-29: qty 15

## 2014-11-29 MED ORDER — BUTORPHANOL TARTRATE 1 MG/ML IJ SOLN
1.0000 mg | INTRAMUSCULAR | Status: DC | PRN
  Administered 2014-11-29: 1 mg via INTRAVENOUS
  Filled 2014-11-29: qty 1

## 2014-11-29 MED ORDER — LIDOCAINE HCL (PF) 1 % IJ SOLN
INTRAMUSCULAR | Status: DC | PRN
  Administered 2014-11-29 (×2): 4 mL

## 2014-11-29 MED ORDER — ONDANSETRON HCL 4 MG/2ML IJ SOLN: 4.0000 mg | Freq: Four times a day (QID) | INTRAMUSCULAR | Status: DC | PRN

## 2014-11-29 MED ORDER — OXYTOCIN 40 UNITS IN LACTATED RINGERS INFUSION - SIMPLE MED: 62.5000 mL/h | INTRAVENOUS | Status: DC

## 2014-11-29 MED ORDER — LIDOCAINE HCL (PF) 1 % IJ SOLN: 30.0000 mL | INTRAMUSCULAR | Status: DC | PRN

## 2014-11-29 MED ORDER — LACTATED RINGERS IV SOLN: 500.0000 mL | INTRAVENOUS | Status: DC | PRN

## 2014-11-29 MED ORDER — OXYCODONE-ACETAMINOPHEN 5-325 MG PO TABS: 2.0000 | ORAL_TABLET | ORAL | Status: DC | PRN

## 2014-11-29 NOTE — Progress Notes (Signed)
Rapid decrease in BP after epidural, with deceleration in fht. Pt repositioned multiple times from left to right side, O2 started, [facility] sve 6/80/-2 w/BBOW. Dr Mora ApplPinn in, reviewed tracing. Recovery to BL of 140 BPM. Current fht 140/no accels/variables.

## 2014-11-29 NOTE — Anesthesia Preprocedure Evaluation (Signed)
Anesthesia Evaluation  Patient identified by MRN, date of birth, ID band Patient awake    Reviewed: Allergy & Precautions, NPO status , Patient's Chart, lab work & pertinent test results  History of Anesthesia Complications Negative for: history of anesthetic complications  Airway Mallampati: III  TM Distance: >3 FB Neck ROM: Full    Dental no notable dental hx. (+) Dental Advisory Given   Pulmonary neg pulmonary ROS,  breath sounds clear to auscultation  Pulmonary exam normal       Cardiovascular negative cardio ROS  Rhythm:Regular Rate:Normal     Neuro/Psych negative neurological ROS  negative psych ROS   GI/Hepatic negative GI ROS, Neg liver ROS,   Endo/Other  diabetesMorbid obesity  Renal/GU negative Renal ROS  negative genitourinary   Musculoskeletal negative musculoskeletal ROS (+)   Abdominal   Peds negative pediatric ROS (+)  Hematology negative hematology ROS (+)   Anesthesia Other Findings   Reproductive/Obstetrics negative OB ROS                             Anesthesia Physical Anesthesia Plan  ASA: III  Anesthesia Plan: Epidural   Post-op Pain Management:    Induction:   Airway Management Planned:   Additional Equipment:   Intra-op Plan:   Post-operative Plan:   Informed Consent: I have reviewed the patients History and Physical, chart, labs and discussed the procedure including the risks, benefits and alternatives for the proposed anesthesia with the patient or authorized representative who has indicated his/her understanding and acceptance.   Dental advisory given  Plan Discussed with:   Anesthesia Plan Comments:         Anesthesia Quick Evaluation

## 2014-11-29 NOTE — Anesthesia Procedure Notes (Signed)
Epidural Patient location during procedure: OB Start time: 11/29/2014 10:55 PM  Staffing Anesthesiologist: Karie SchwalbeJUDD, Meghan Griffin Performed by: anesthesiologist   Preanesthetic Checklist Completed: patient identified, site marked, surgical consent, pre-op evaluation, timeout performed, IV checked, risks and benefits discussed and monitors and equipment checked  Epidural Patient position: sitting Prep: site prepped and draped and DuraPrep Patient monitoring: continuous pulse ox and blood pressure Approach: midline Location: L3-L4 Injection technique: LOR saline  Needle:  Needle type: Tuohy  Needle gauge: 17 G Needle length: 9 cm and 9 Needle insertion depth: 9 cm Catheter type: closed end flexible Catheter size: 19 Gauge Catheter at skin depth: 15 cm Test dose: negative  Assessment Events: blood not aspirated, injection not painful, no injection resistance, negative IV test and no paresthesia  Additional Notes Patient identified. Risks/Benefits/Options discussed with patient including but not limited to bleeding, infection, nerve damage, paralysis, failed block, incomplete pain control, headache, blood pressure changes, nausea, vomiting, reactions to medication both or allergic, itching and postpartum back pain. Confirmed with bedside nurse the patient's most recent platelet count. Confirmed with patient that they are not currently taking any anticoagulation, have any bleeding history or any family history of bleeding disorders. Patient expressed understanding and wished to proceed. All questions were answered. Sterile technique was used throughout the entire procedure. Please see nursing notes for vital signs. Test dose was given through epidural catheter and negative prior to continuing to dose epidural or start infusion. Warning signs of high block given to the patient including shortness of breath, tingling/numbness in hands, complete motor block, or any concerning symptoms with  instructions to call for help. Patient was given instructions on fall risk and not to get out of bed. All questions and concerns addressed with instructions to call with any issues or inadequate analgesia.

## 2014-11-29 NOTE — MAU Note (Signed)
Pt presents complaining of contractions every 10 min. Denies vaginal bleeding, discharge or leaking of fluid. Reports good fetal movement. Contractions started at 7am.

## 2014-11-30 ENCOUNTER — Encounter (HOSPITAL_COMMUNITY)
Admission: AD | Disposition: A | Discharge status: Home / Self Care | Payer: Self-pay | Source: Ambulatory Visit | Attending: Obstetrics & Gynecology

## 2014-11-30 ENCOUNTER — Encounter (HOSPITAL_COMMUNITY): Payer: Self-pay | Admitting: *Deleted

## 2014-11-30 DIAGNOSIS — O99824 Streptococcus B carrier state complicating childbirth: Secondary | ICD-10-CM

## 2014-11-30 DIAGNOSIS — Z98891 History of uterine scar from previous surgery: Secondary | ICD-10-CM

## 2014-11-30 DIAGNOSIS — O99214 Obesity complicating childbirth: Secondary | ICD-10-CM

## 2014-11-30 DIAGNOSIS — Z6841 Body Mass Index (BMI) 40.0 and over, adult: Secondary | ICD-10-CM

## 2014-11-30 LAB — CBC
HCT: 35.4 % — ABNORMAL LOW (ref 36.0–46.0)
HEMOGLOBIN: 12.1 g/dL (ref 12.0–15.0)
MCH: 30.9 pg (ref 26.0–34.0)
MCHC: 34.2 g/dL (ref 30.0–36.0)
MCV: 90.5 fL (ref 78.0–100.0)
PLATELETS: 129 10*3/uL — AB (ref 150–400)
RBC: 3.91 MIL/uL (ref 3.87–5.11)
RDW: 14.1 % (ref 11.5–15.5)
WBC: 13.8 10*3/uL — ABNORMAL HIGH (ref 4.0–10.5)

## 2014-11-30 LAB — ABO/RH: ABO/RH(D): O POS

## 2014-11-30 SURGERY — Surgical Case
Anesthesia: Epidural | Site: Abdomen

## 2014-11-30 MED ORDER — DIPHENHYDRAMINE HCL 50 MG/ML IJ SOLN: 12.5000 mg | INTRAMUSCULAR | Status: DC | PRN

## 2014-11-30 MED ORDER — LACTATED RINGERS IV SOLN
INTRAVENOUS | Status: DC | PRN
  Administered 2014-11-30: 01:00:00 via INTRAVENOUS

## 2014-11-30 MED ORDER — IBUPROFEN 600 MG PO TABS
600.0000 mg | ORAL_TABLET | Freq: Four times a day (QID) | ORAL | Status: DC
  Administered 2014-11-30 – 2014-12-02 (×8): 600 mg via ORAL
  Filled 2014-11-30 (×9): qty 1

## 2014-11-30 MED ORDER — ONDANSETRON HCL 4 MG PO TABS: 4.0000 mg | ORAL_TABLET | ORAL | Status: DC | PRN

## 2014-11-30 MED ORDER — ONDANSETRON HCL 4 MG/2ML IJ SOLN: 4.0000 mg | Freq: Once | INTRAMUSCULAR | Status: DC | PRN

## 2014-11-30 MED ORDER — MENTHOL 3 MG MT LOZG: 1.0000 | LOZENGE | OROMUCOSAL | Status: DC | PRN

## 2014-11-30 MED ORDER — PHENYLEPHRINE 8 MG IN D5W 100 ML (0.08MG/ML) PREMIX OPTIME
INJECTION | INTRAVENOUS | Status: DC | PRN
  Administered 2014-11-30: 45 ug/min via INTRAVENOUS

## 2014-11-30 MED ORDER — TETANUS-DIPHTH-ACELL PERTUSSIS 5-2.5-18.5 LF-MCG/0.5 IM SUSP: 0.5000 mL | Freq: Once | INTRAMUSCULAR | Status: DC

## 2014-11-30 MED ORDER — MORPHINE SULFATE 0.5 MG/ML IJ SOLN
INTRAMUSCULAR | Status: AC
  Filled 2014-11-30: qty 10

## 2014-11-30 MED ORDER — PHENYLEPHRINE HCL 10 MG/ML IJ SOLN
INTRAMUSCULAR | Status: DC | PRN
  Administered 2014-11-30 (×3): 80 ug via INTRAVENOUS

## 2014-11-30 MED ORDER — NALBUPHINE HCL 10 MG/ML IJ SOLN: 5.0000 mg | INTRAMUSCULAR | Status: DC | PRN

## 2014-11-30 MED ORDER — SCOPOLAMINE 1 MG/3DAYS TD PT72
MEDICATED_PATCH | TRANSDERMAL | Status: AC
  Filled 2014-11-30: qty 1

## 2014-11-30 MED ORDER — OXYCODONE-ACETAMINOPHEN 5-325 MG PO TABS: 2.0000 | ORAL_TABLET | ORAL | Status: DC | PRN

## 2014-11-30 MED ORDER — ONDANSETRON HCL 4 MG/2ML IJ SOLN
INTRAMUSCULAR | Status: DC | PRN
  Administered 2014-11-30: 4 mg via INTRAVENOUS

## 2014-11-30 MED ORDER — SODIUM CHLORIDE 0.9 % IJ SOLN: 3.0000 mL | INTRAMUSCULAR | Status: DC | PRN

## 2014-11-30 MED ORDER — LANOLIN HYDROUS EX OINT: 1.0000 "application " | TOPICAL_OINTMENT | CUTANEOUS | Status: DC | PRN

## 2014-11-30 MED ORDER — OXYCODONE-ACETAMINOPHEN 5-325 MG PO TABS
1.0000 | ORAL_TABLET | ORAL | Status: DC | PRN
  Administered 2014-12-01 – 2014-12-02 (×2): 1 via ORAL
  Filled 2014-11-30 (×2): qty 1

## 2014-11-30 MED ORDER — DIBUCAINE 1 % RE OINT: 1.0000 "application " | TOPICAL_OINTMENT | RECTAL | Status: DC | PRN

## 2014-11-30 MED ORDER — SIMETHICONE 80 MG PO CHEW
80.0000 mg | CHEWABLE_TABLET | ORAL | Status: DC
  Administered 2014-12-01 (×2): 80 mg via ORAL
  Filled 2014-11-30 (×2): qty 1

## 2014-11-30 MED ORDER — ONDANSETRON HCL 4 MG/2ML IJ SOLN: 4.0000 mg | INTRAMUSCULAR | Status: DC | PRN

## 2014-11-30 MED ORDER — ONDANSETRON HCL 4 MG/2ML IJ SOLN
INTRAMUSCULAR | Status: AC
  Filled 2014-11-30: qty 2

## 2014-11-30 MED ORDER — WITCH HAZEL-GLYCERIN EX PADS: 1.0000 "application " | MEDICATED_PAD | CUTANEOUS | Status: DC | PRN

## 2014-11-30 MED ORDER — KETOROLAC TROMETHAMINE 30 MG/ML IJ SOLN: 30.0000 mg | Freq: Four times a day (QID) | INTRAMUSCULAR | Status: DC | PRN

## 2014-11-30 MED ORDER — FENTANYL CITRATE 0.05 MG/ML IJ SOLN: 25.0000 ug | INTRAMUSCULAR | Status: DC | PRN

## 2014-11-30 MED ORDER — PRENATAL MULTIVITAMIN CH
1.0000 | ORAL_TABLET | Freq: Every day | ORAL | Status: DC
  Administered 2014-11-30 – 2014-12-02 (×3): 1 via ORAL
  Filled 2014-11-30 (×3): qty 1

## 2014-11-30 MED ORDER — SODIUM BICARBONATE 8.4 % IV SOLN
INTRAVENOUS | Status: DC | PRN
  Administered 2014-11-30 (×2): 5 mL via EPIDURAL

## 2014-11-30 MED ORDER — NALOXONE HCL 0.4 MG/ML IJ SOLN: 0.4000 mg | INTRAMUSCULAR | Status: DC | PRN

## 2014-11-30 MED ORDER — LACTATED RINGERS IV SOLN
INTRAVENOUS | Status: DC
  Administered 2014-11-30: 12:00:00 via INTRAVENOUS

## 2014-11-30 MED ORDER — ONDANSETRON HCL 4 MG/2ML IJ SOLN: 4.0000 mg | Freq: Three times a day (TID) | INTRAMUSCULAR | Status: DC | PRN

## 2014-11-30 MED ORDER — SIMETHICONE 80 MG PO CHEW
80.0000 mg | CHEWABLE_TABLET | Freq: Three times a day (TID) | ORAL | Status: DC
  Administered 2014-11-30 – 2014-12-02 (×7): 80 mg via ORAL
  Filled 2014-11-30 (×7): qty 1

## 2014-11-30 MED ORDER — PHENYLEPHRINE 40 MCG/ML (10ML) SYRINGE FOR IV PUSH (FOR BLOOD PRESSURE SUPPORT)
PREFILLED_SYRINGE | INTRAVENOUS | Status: AC
  Filled 2014-11-30: qty 10

## 2014-11-30 MED ORDER — MEPERIDINE HCL 25 MG/ML IJ SOLN
INTRAMUSCULAR | Status: DC | PRN
  Administered 2014-11-30 (×2): 12.5 mg via INTRAVENOUS

## 2014-11-30 MED ORDER — SIMETHICONE 80 MG PO CHEW: 80.0000 mg | CHEWABLE_TABLET | ORAL | Status: DC | PRN

## 2014-11-30 MED ORDER — PHENYLEPHRINE 8 MG IN D5W 100 ML (0.08MG/ML) PREMIX OPTIME
INJECTION | INTRAVENOUS | Status: AC
  Filled 2014-11-30: qty 100

## 2014-11-30 MED ORDER — DIPHENHYDRAMINE HCL 25 MG PO CAPS: 25.0000 mg | ORAL_CAPSULE | ORAL | Status: DC | PRN

## 2014-11-30 MED ORDER — MORPHINE SULFATE (PF) 0.5 MG/ML IJ SOLN
INTRAMUSCULAR | Status: DC | PRN
  Administered 2014-11-30: 4 mg via EPIDURAL

## 2014-11-30 MED ORDER — SCOPOLAMINE 1 MG/3DAYS TD PT72
MEDICATED_PATCH | TRANSDERMAL | Status: DC | PRN
  Administered 2014-11-30: 1 via TRANSDERMAL

## 2014-11-30 MED ORDER — OXYTOCIN 40 UNITS IN LACTATED RINGERS INFUSION - SIMPLE MED: 62.5000 mL/h | INTRAVENOUS | Status: AC

## 2014-11-30 MED ORDER — NALBUPHINE HCL 10 MG/ML IJ SOLN: 5.0000 mg | Freq: Once | INTRAMUSCULAR | Status: AC | PRN

## 2014-11-30 MED ORDER — DEXAMETHASONE SODIUM PHOSPHATE 10 MG/ML IJ SOLN
INTRAMUSCULAR | Status: AC
  Filled 2014-11-30: qty 1

## 2014-11-30 MED ORDER — DIPHENHYDRAMINE HCL 25 MG PO CAPS: 25.0000 mg | ORAL_CAPSULE | Freq: Four times a day (QID) | ORAL | Status: DC | PRN

## 2014-11-30 MED ORDER — LACTATED RINGERS IV SOLN
INTRAVENOUS | Status: DC | PRN
  Administered 2014-11-30 (×2): via INTRAVENOUS

## 2014-11-30 MED ORDER — OXYTOCIN 10 UNIT/ML IJ SOLN
40.0000 [IU] | INTRAVENOUS | Status: DC | PRN
  Administered 2014-11-30: 40 [IU] via INTRAVENOUS

## 2014-11-30 MED ORDER — ACETAMINOPHEN 500 MG PO TABS
1000.0000 mg | ORAL_TABLET | Freq: Four times a day (QID) | ORAL | Status: AC
  Administered 2014-11-30 – 2014-12-01 (×4): 1000 mg via ORAL
  Filled 2014-11-30 (×4): qty 2

## 2014-11-30 MED ORDER — ZOLPIDEM TARTRATE 5 MG PO TABS: 5.0000 mg | ORAL_TABLET | Freq: Every evening | ORAL | Status: DC | PRN

## 2014-11-30 MED ORDER — SENNOSIDES-DOCUSATE SODIUM 8.6-50 MG PO TABS
2.0000 | ORAL_TABLET | ORAL | Status: DC
  Administered 2014-12-01 (×2): 2 via ORAL
  Filled 2014-11-30 (×2): qty 2

## 2014-11-30 MED ORDER — SCOPOLAMINE 1 MG/3DAYS TD PT72
1.0000 | MEDICATED_PATCH | Freq: Once | TRANSDERMAL | Status: DC
  Filled 2014-11-30: qty 1

## 2014-11-30 MED ORDER — DEXAMETHASONE SODIUM PHOSPHATE 10 MG/ML IJ SOLN
INTRAMUSCULAR | Status: DC | PRN
  Administered 2014-11-30: 10 mg via INTRAVENOUS

## 2014-11-30 MED ORDER — NALOXONE HCL 1 MG/ML IJ SOLN
1.0000 ug/kg/h | INTRAVENOUS | Status: DC | PRN
  Filled 2014-11-30: qty 2

## 2014-11-30 MED ORDER — LIDOCAINE HCL (CARDIAC) 20 MG/ML IV SOLN
INTRAVENOUS | Status: AC
  Filled 2014-11-30: qty 5

## 2014-11-30 MED ORDER — MEPERIDINE HCL 25 MG/ML IJ SOLN: 6.2500 mg | INTRAMUSCULAR | Status: DC | PRN

## 2014-11-30 MED ORDER — OXYTOCIN 10 UNIT/ML IJ SOLN
INTRAMUSCULAR | Status: AC
  Filled 2014-11-30: qty 4

## 2014-11-30 MED ORDER — MEPERIDINE HCL 25 MG/ML IJ SOLN
INTRAMUSCULAR | Status: AC
  Filled 2014-11-30: qty 1

## 2014-11-30 SURGICAL SUPPLY — 40 items
APL SKNCLS STERI-STRIP NONHPOA (GAUZE/BANDAGES/DRESSINGS) ×1
BENZOIN TINCTURE PRP APPL 2/3 (GAUZE/BANDAGES/DRESSINGS) ×2 IMPLANT
CLAMP CORD UMBIL (MISCELLANEOUS) IMPLANT
CLOTH BEACON ORANGE TIMEOUT ST (SAFETY) ×2 IMPLANT
DRAPE SHEET LG 3/4 BI-LAMINATE (DRAPES) IMPLANT
DRSG OPSITE POSTOP 4X10 (GAUZE/BANDAGES/DRESSINGS) ×2 IMPLANT
DRSG TELFA 3X8 NADH (GAUZE/BANDAGES/DRESSINGS) ×2 IMPLANT
DURAPREP 26ML APPLICATOR (WOUND CARE) ×2 IMPLANT
ELECT REM PT RETURN 9FT ADLT (ELECTROSURGICAL) ×2
ELECTRODE REM PT RTRN 9FT ADLT (ELECTROSURGICAL) ×1 IMPLANT
EXTRACTOR VACUUM BELL STYLE (SUCTIONS) IMPLANT
GLOVE BIO SURGEON STRL SZ 6.5 (GLOVE) ×2 IMPLANT
GLOVE BIOGEL PI IND STRL 7.0 (GLOVE) ×1 IMPLANT
GLOVE BIOGEL PI INDICATOR 7.0 (GLOVE) ×1
GOWN STRL REUS W/TWL LRG LVL3 (GOWN DISPOSABLE) ×6 IMPLANT
KIT ABG SYR 3ML LUER SLIP (SYRINGE) ×2 IMPLANT
NDL HYPO 25X5/8 SAFETYGLIDE (NEEDLE) IMPLANT
NEEDLE HYPO 25X5/8 SAFETYGLIDE (NEEDLE) ×4 IMPLANT
NS IRRIG 1000ML POUR BTL (IV SOLUTION) ×2 IMPLANT
PACK C SECTION WH (CUSTOM PROCEDURE TRAY) ×2 IMPLANT
PAD ABD 8X7 1/2 STERILE (GAUZE/BANDAGES/DRESSINGS) ×1 IMPLANT
PAD DRESSING TELFA 3X8 NADH (GAUZE/BANDAGES/DRESSINGS) IMPLANT
PAD OB MATERNITY 4.3X12.25 (PERSONAL CARE ITEMS) ×2 IMPLANT
RTRCTR C-SECT PINK 25CM LRG (MISCELLANEOUS) ×2 IMPLANT
SPONGE GAUZE 4X4 12PLY STER LF (GAUZE/BANDAGES/DRESSINGS) ×1 IMPLANT
STAPLER VISISTAT 35W (STAPLE) IMPLANT
STRIP CLOSURE SKIN 1/2X4 (GAUZE/BANDAGES/DRESSINGS) ×2 IMPLANT
SUT CHROMIC 2 0 CT 1 (SUTURE) ×2 IMPLANT
SUT CHROMIC 3 0 SH 27 (SUTURE) ×2 IMPLANT
SUT MNCRL 0 VIOLET CTX 36 (SUTURE) ×2 IMPLANT
SUT MONOCRYL 0 CTX 36 (SUTURE) ×3
SUT PDS AB 0 CTX 36 PDP370T (SUTURE) IMPLANT
SUT PLAIN 2 0 (SUTURE)
SUT PLAIN ABS 2-0 CT1 27XMFL (SUTURE) IMPLANT
SUT VIC AB 0 CT1 27 (SUTURE)
SUT VIC AB 0 CT1 27XBRD ANBCTR (SUTURE) IMPLANT
SUT VIC AB 4-0 KS 27 (SUTURE) ×2 IMPLANT
TAPE CLOTH SURG 4X10 WHT LF (GAUZE/BANDAGES/DRESSINGS) ×1 IMPLANT
TOWEL OR 17X24 6PK STRL BLUE (TOWEL DISPOSABLE) ×2 IMPLANT
TRAY FOLEY CATH 14FR (SET/KITS/TRAYS/PACK) ×1 IMPLANT

## 2014-11-30 NOTE — Transfer of Care (Signed)
Immediate Anesthesia Transfer of Care Note  Patient: Meghan Griffin  Procedure(s) Performed: Procedure(s): CESAREAN SECTION (N/A)  Patient Location: PACU  Anesthesia Type:Epidural  Level of Consciousness: awake, alert , oriented and patient cooperative  Airway & Oxygen Therapy: Patient Spontanous Breathing  Post-op Assessment: Report given to RN and Post -op Vital signs reviewed and stable  Post vital signs: Reviewed and stable  Last Vitals:  Filed Vitals:   11/29/14 2301  BP: 169/75  Pulse: 97  Temp:   Resp:     Complications: No apparent anesthesia complications

## 2014-11-30 NOTE — Anesthesia Postprocedure Evaluation (Signed)
  Anesthesia Post-op Note  Patient: Meghan Griffin  Procedure(s) Performed: Procedure(s): CESAREAN SECTION (N/A)  Patient Location: Mother/Baby  Anesthesia Type:Epidural  Level of Consciousness: awake, alert , oriented and patient cooperative  Airway and Oxygen Therapy: Patient Spontanous Breathing  Post-op Pain: mild  Post-op Assessment: Post-op Vital signs reviewed, Patient's Cardiovascular Status Stable, Respiratory Function Stable, Patent Airway, No signs of Nausea or vomiting, Adequate PO intake, Pain level controlled, No headache, No backache, No residual numbness and No residual motor weakness  Post-op Vital Signs: Reviewed and stable  Last Vitals:  Filed Vitals:   11/30/14 0600  BP: 137/66  Pulse: 100  Temp: 37 C  Resp: 20    Complications: No apparent anesthesia complications

## 2014-11-30 NOTE — Progress Notes (Signed)
Patient seen and evaluated at bedside Fetal heart rate tracing with baseline of 140s minimal to no variability with repetitive late decelerations.  Patient counseled that as she is only 7cm and this is her first baby my recommendation is to proceed with a primary cesarean section.   She voice agreement and we reviewed all risks to the surgery. Anesthesia aware. Consent signed and placed into chart.   Aden Youngman [name]

## 2014-11-30 NOTE — Progress Notes (Signed)
Patient is tolerating liquids.  Pain control is good.  Appropriate lochia.  No complaints.  Filed Vitals:   11/30/14 0500 11/30/14 0600 11/30/14 0814 11/30/14 1100  BP: 138/77 137/66 122/60 120/67  Pulse: 98 100 92 79  Temp: 98.6 F (37 C) 98.6 F (37 C) 98.3 F (36.8 C) 98.4 F (36.9 C)  TempSrc: Oral Oral Oral Oral  Resp: 18 20 20 20   Height:      Weight:      SpO2: 94% 95% 95% 94%    [employer]: c/d/i Ext: no CT  Lab Results  Component Value Date   WBC 13.8* 11/30/2014   HGB 12.1 11/30/2014   HCT 35.4* 11/30/2014   MCV 90.5 11/30/2014   PLT 129* 11/30/2014    --/--/O POS, O POS (02/08 1940)  A/P Postop day #0 s/p c/s for NRFHT. Foley to be removed Encouraged advancing diet slowing Encouraged ambulation.  Routine care.  Philip AspenALLAHAN, Lonney Revak

## 2014-11-30 NOTE — Addendum Note (Signed)
Addendum  created 11/30/14 [id number] by Suella Groveoderick C Junnie Loschiavo, CRNA   Modules edited: Notes Section   Notes Section:  File: [id number]

## 2014-11-30 NOTE — H&P (Signed)
Meghan Griffin is a 34 y.o. female presenting for painful regular contractions.  No vaginal bleeding no leaking of fluid, notes good fetal movement.   Maternal Medical History:  Reason for admission: Contractions.  Nausea.  Contractions: Onset was 6-12 hours ago.   Frequency: regular.   Duration is approximately 60 seconds.   Perceived severity is moderate.    Fetal activity: Perceived fetal activity is normal.   Last perceived fetal movement was within the past 12 hours.    Prenatal complications: no prenatal complications Prenatal Complications - Diabetes: none.    OB History    Gravida Para Term Preterm AB TAB SAB Ectopic Multiple Living   1         0     Past Medical History  Diagnosis Date  . History of gonorrhea 2006    treated  . Obesity   . Eczema   . Hemorrhoids    Past Surgical History  Procedure Laterality Date  . Tooth extraction     Family History: family history includes Alcohol abuse in her father; CAD (age of onset: 51) in her father; COPD in her father; Cancer in her other and paternal aunt; Diabetes in her father; Hyperlipidemia in her father, maternal grandfather, and maternal grandmother; Hypertension in her father, maternal grandfather, and maternal grandmother; Stroke (age of onset: 23) in her father. Social History:  reports that she has never smoked. She has never used smokeless tobacco. She reports that she does not drink alcohol or use illicit drugs.   Prenatal Transfer Tool  Maternal Diabetes: No Genetic Screening: Normal Amnio - 31 XX Maternal Ultrasounds/Referrals: Normal Fetal Ultrasounds or other Referrals:  Referred to Materal Fetal Medicine  Maternal Substance Abuse:  No Significant Maternal Medications:  None Significant Maternal Lab Results:  Lab values include: Group B Strep positive Other Comments:  None  Review of Systems  Constitutional: Negative for fever and chills.  HENT: Negative for hearing loss.   Eyes: Negative for  blurred vision and double vision.  Respiratory: Negative for cough and hemoptysis.   Cardiovascular: Negative for chest pain and palpitations.  Gastrointestinal: Negative for heartburn and nausea.  Genitourinary: Negative for dysuria and urgency.  Musculoskeletal: Negative for myalgias and neck pain.  Skin: Negative for rash.  Neurological: Negative for dizziness, tingling and headaches.  Endo/Heme/Allergies: Negative for environmental allergies. Does not bruise/bleed easily.  Psychiatric/Behavioral: Negative for depression and suicidal ideas.    Dilation: 7 Effacement (%): 100 Station: -2 Exam by:: Alla German RN Blood pressure 169/75, pulse 97, temperature 98.6 F (37 C), temperature source Oral, resp. rate 18, height  (1.575 m), weight 125.646 kg (277 lb), last menstrual period 02/23/2014, SpO2 99 %. Maternal Exam:  Uterine Assessment: Contraction strength is moderate.  Contraction duration is 60 seconds. Contraction frequency is regular.   Abdomen: Patient reports no abdominal tenderness. Fundal height is 38 cm.   Estimated fetal weight is 2800 grams.   Fetal presentation: vertex  Introitus: Normal vulva. Normal vagina.  Ferning test: not done.  Nitrazine test: not done. Amniotic fluid character: not assessed.  Pelvis: adequate for delivery.   Cervix: Cervix evaluated by digital exam.   4/100/0  Fetal Exam Fetal Monitor Review: Mode: hand-held doppler probe.   Baseline rate: 140.  Variability: minimal (<5 bpm).   Pattern: no accelerations, no decelerations and variable decelerations.    Fetal State Assessment: Category II - tracings are indeterminate.     Physical Exam  Vitals reviewed. Constitutional: She is oriented to  person, place, and time. She appears well-developed and well-nourished.  HENT:  Head: Normocephalic.  Eyes: Pupils are equal, round, and reactive to light.  Cardiovascular: Normal rate and regular rhythm.   Respiratory: Effort normal and  breath sounds normal.  GI: Soft.  Musculoskeletal: Normal range of motion.  Neurological: She is alert and oriented to person, place, and time. She has normal reflexes.  Skin: Skin is warm.  Psychiatric: She has a normal mood and affect.    Prenatal labs: ABO, Rh: --/--/O POS (02/08 1940) Antibody: NEG (02/08 1940) Rubella: Immune (08/11 0000) RPR: Nonreactive (11/30 0000)  HBsAg: Negative (08/11 0000)  HIV: Non-reactive (08/11 0000)  GBS: Positive (01/20 0000)   Assessment/Plan: 34 yo G1P0 at 38 weeks 5 days in early active labor Will admit to L&D Epidural  Continuous monitoring   Jeralynn Vaquera [name] 11/30/2014, 12:25 AM

## 2014-11-30 NOTE — Anesthesia Postprocedure Evaluation (Signed)
  Anesthesia Post-op Note  Patient: Dorien Chihuahuaatalie A Cart  Procedure(s) Performed: Procedure(s) (LRB): CESAREAN SECTION (N/A)  Patient Location: PACU  Anesthesia Type: Epidural  Level of Consciousness: awake and alert   Airway and Oxygen Therapy: Patient Spontanous Breathing  Post-op Pain: mild  Post-op Assessment: Post-op Vital signs reviewed, Patient's Cardiovascular Status Stable, Respiratory Function Stable, Patent Airway and No signs of Nausea or vomiting  Last Vitals:  Filed Vitals:   11/30/14 0315  BP: 133/68  Pulse: 95  Temp:   Resp: 23    Post-op Vital Signs: stable   Complications: No apparent anesthesia complications

## 2014-11-30 NOTE — Op Note (Signed)
Cesarean Section Procedure Note  Indications: 34 yo G1P0 at 38 weeks 5 days with Non-reassuring fetal heart tracing Cat III  Pre-operative Diagnosis: Non-Reassuring Fetal Heart Tracing  Post-operative Diagnosis: same  Surgeon: Meghan HazardPINN, Meghan Griffin   Assistants: Dr. Penne Griffin  Anesthesia: Epidural anesthesia  ASA Class: 2  Procedure Details   The patient was counseled about the risks, benefits, complications of the cesarean section using an interpreter. The patient concurred with the proposed plan, giving informed consent.  The site of surgery properly noted/marked. The patient was taken to Operating Room # 1, identified as Meghan Hartshornatalie Griffin and the procedure verified as C-Section Delivery. A Time Out was held and the above information confirmed.  After epidural anesthesia was found to be adequate, the patient was placed in the dorsal supine position with a leftward tilt, the fetal heart beat was found in the 80's 90's. The abdomen was prepped and draped. A Pfannenstiel incision was made and carried down through the subcutaneous tissue to the fascia.  The fascia was incised in the midline and the fascial incision was opened and extended laterally followed by opening the abdominal peritoneum bluntly using surgeons fingers. The Alexis retractor was then deployed. The scalpel was then used to make a low transverse incision on the uterus which was extended laterally with  blunt dissection. The fetal vertex was identified, and the head delivered with assistance with pushing on the uterine fundus.  The head was then delivered easily through the uterine incision followed by the body. The A live female infant was bulb suctioned on the operative field cried vigorously, cord was clamped and cut and the infant was passed to the waiting neonatologist. Apgars 8/9. Placenta was then delivered spontaneously, intact and appeared to possibly have a fundal abruption, the uterus was cleared of all clot and debris. The  uterine incision was repaired with #0 Monocryl in running locked fashion. A second imbricating suture was performed using the same suture. The incision was hemostatic. Ovaries and tubes were inspected and normal. The Alexis retractor was removed. The abdominal cavity was cleared of all clot and debris. The abdominal peritoneum was reapproximated with 2-0 chromic  in a running fashion, the rectus muscles was reapproximated with #2 chromic in interrupted fashion. The fascia was closed with 0 Vicryl in a running fashion. The subcuticular layer was irrigated and all bleeders cauterized.  The subcutaneous layer was re-approximated with 2-0 plain gut.   The skin was closed with 4-0 vicryl in a subcuticular fashion using a Mellody DanceKeith needle. The incision was dressed with benzoine, steri strips and pressure dressing. All sponge lap and needle counts were correct x3. Patient tolerated the procedure well and recovered in stable condition following the procedure.  Instrument, sponge, and needle counts were correct prior the abdominal closure and at the conclusion of the case.   Findings: Live female infant, Apgars 8/9, clear amniotic fluid, normal appearing placenta, normal uterus, bilateral tubes and ovaries  Estimated Blood Loss: 750 mL         Drains: Foley catheter         Specimens: Placenta to L&D         Implants: none         Complications:  None; patient tolerated the procedure well.         Disposition: PACU - hemodynamically stable.   [name] Griffin

## 2014-11-30 NOTE — Lactation Note (Signed)
This note was copied from the chart of Meghan Catarina Hartshornatalie Smeal. Lactation Consultation Note  According to mother baby recently bf on right breast for 10 min. Demonstrated how to hand express with teachback.  Small drops expressed. Mother attempted to latch on left breast in football hold.  Baby very sleepy.  Baby latched for 5 min. Demonstrated how to achieve a deep latch, nipple to chin and hug her in and wait until she opens wide. Suggest mother always feed STS to wake baby up and massage breast to keep her active. Mom encouraged to feed baby 8-12 times/24 hours and with feeding cues.  Mom made aware of O/P services, breastfeeding support groups, community resources, and our phone # for post-discharge questions.    Patient Name: Meghan Griffin ZOXWR'UToday's Date: 11/30/2014 Reason for consult: Initial assessment   Maternal Data    Feeding Feeding Type: Breast Fed Length of feed: 5 min  LATCH Score/Interventions Latch: Repeated attempts needed to sustain latch, nipple held in mouth throughout feeding, stimulation needed to elicit sucking reflex. Intervention(s): Waking techniques Intervention(s): Breast massage;Assist with latch;Adjust position  Audible Swallowing: None Intervention(s): Skin to skin;Hand expression  Type of Nipple: Everted at rest and after stimulation  Comfort (Breast/Nipple): Soft / non-tender     Hold (Positioning): Assistance needed to correctly position infant at breast and maintain latch.  LATCH Score: 6  Lactation Tools Discussed/Used     Consult Status Consult Status: Follow-up Date: 12/01/14 Follow-up type: In-patient    Dahlia ByesBerkelhammer, [facility] 11/30/2014, 1:09 PM

## 2014-12-01 ENCOUNTER — Encounter (HOSPITAL_COMMUNITY): Payer: Self-pay | Admitting: Obstetrics & Gynecology

## 2014-12-01 LAB — RPR: RPR: NONREACTIVE

## 2014-12-01 NOTE — Lactation Note (Signed)
This note was copied from the chart of Meghan Catarina Hartshornatalie Ikard. Lactation Consultation Note  Baby has not breastfed since this morning at 0925. Baby sucks tongue and has a tight suck. LC unable to hand express any drops. Attempted in fb and cross cradle on both breasts but baby would not open wide enough to latch deep. Brief periods of sucks, no swallows with only nipple in mouth. Suggest mother keep baby STS to wake her to feed and post pump. Suggest she post pump at least 4-6 times a day for 15-20 min. Discussed not using a pacifier and reasons why.  Saw pacifier in room. Mother is going to eat and then reattempt latching.  Suggest to MicrosoftDonna RN that if she is unsuccessful latching at next feeding to start supplementing w/ formula.   Patient Name: Meghan Griffin ZOXWR'UToday's Date: 12/01/2014 Reason for consult: Follow-up assessment   Maternal Data    Feeding    LATCH Score/Interventions Latch: Too sleepy or reluctant, no latch achieved, no sucking elicited. Intervention(s): Teach feeding cues;Waking techniques;Skin to skin Intervention(s): Adjust position;Assist with latch;Breast massage  Audible Swallowing: None  Type of Nipple: Everted at rest and after stimulation  Comfort (Breast/Nipple): Soft / non-tender     Hold (Positioning): Assistance needed to correctly position infant at breast and maintain latch.  LATCH Score: 5  Lactation Tools Discussed/Used     Consult Status Consult Status: Follow-up Date: 12/01/14 Follow-up type: In-patient    Dahlia ByesBerkelhammer, [facility] Griffin [facility] 12/01/2014, 3:06 PM

## 2014-12-02 MED ORDER — IBUPROFEN 600 MG PO TABS: 600.0000 mg | ORAL_TABLET | Freq: Four times a day (QID) | ORAL | Status: DC | PRN

## 2014-12-02 MED ORDER — OXYCODONE-ACETAMINOPHEN 5-325 MG PO TABS: 1.0000 | ORAL_TABLET | ORAL | Status: DC | PRN

## 2014-12-02 NOTE — Lactation Note (Signed)
This note was copied from the chart of Meghan Catarina Hartshornatalie Isaac. Lactation Consultation Note Noted 9% weight loss. Mom has been having trouble latching baby, when baby BF has been poor feeder. Mom has DEBP set up. Assessed moms breast and nipples. Mom has large breast, large nipples. Baby has a tight strong suck and bites. Limited movement of tongue. Applied #24 NS, baby couldn't get it in it's mouth, and #20NS is to small for moms nipple. Encouraged mom to pump and bottle feed until the baby's mouth grows into her nipple and is able to open her mouth wide enough for a deep latch. Encouraged to pump every three hours for 15-20 min. Not able to hand express or pump colostrum. Explained need to stimulate breast and will need to supplement w/formula until mom is able to produce colostrum and milk. Reported to RN. strick I&O encouraged. Patient Name: Meghan Griffin ZOXWR'UToday's Date: 12/02/2014 Reason for consult: Follow-up assessment;Infant weight loss   Maternal Data Has patient been taught Hand Expression?: Yes Does the patient have breastfeeding experience prior to this delivery?: No  Feeding Feeding Type: Formula Nipple Type: Slow - flow Length of feed: 0 min  LATCH Score/Interventions Latch: Repeated attempts needed to sustain latch, nipple held in mouth throughout feeding, stimulation needed to elicit sucking reflex. Intervention(s): Skin to skin;Teach feeding cues;Waking techniques Intervention(s): Adjust position;Assist with latch;Breast massage;Breast compression  Audible Swallowing: None Intervention(s): Hand expression  Type of Nipple: Everted at rest and after stimulation  Comfort (Breast/Nipple): Soft / non-tender     Hold (Positioning): Assistance needed to correctly position infant at breast and maintain latch. Intervention(s): Breastfeeding basics reviewed;Support Pillows;Position options;Skin to skin  LATCH Score: 6  Lactation Tools Discussed/Used Tools: Nipple  Meghan Griffin;Pump Nipple shield size: 24 Breast pump type: Double-Electric Breast Pump Pump Review: Setup, frequency, and cleaning;Milk Storage Initiated by:: RN Date initiated:: 12/01/14   Consult Status Consult Status: Follow-up Date: 12/02/14 Follow-up type: In-patient    Meghan Griffin, Diamond NickelLAURA G 12/02/2014, 5:09 AM

## 2014-12-02 NOTE — Discharge Summary (Signed)
Obstetric Discharge Summary Reason for Admission: onset of labor Prenatal Procedures: NST and ultrasound Intrapartum Procedures: cesarean: low cervical, transverse Postpartum Procedures: none Complications-Operative and Postpartum: none HEMOGLOBIN  Date Value Ref Range Status  11/30/2014 12.1 12.0 - 15.0 g/dL Final   HCT  Date Value Ref Range Status  11/30/2014 35.4* 36.0 - 46.0 % Final    Physical Exam:  General: alert, cooperative, appears stated age and no distress Lochia: appropriate Uterine Fundus: firm Incision: healing well, no significant drainage, no dehiscence, no significant erythema DVT Evaluation: No evidence of DVT seen on physical exam. Negative Homan's sign. No cords or calf tenderness.   Discharge Diagnoses: Term Pregnancy-delivered  Discharge Information: Date: 12/02/2014 Activity: pelvic rest and no heavy lifting Diet: routine Medications: PNV, Ibuprofen, Colace and Percocet Condition: stable Instructions: refer to practice specific booklet Discharge to: home   Newborn Data: Live born female  Birth Weight: 6 lb 14.6 oz (3135 g) APGAR: 8, 9  Home with mother.  Essie HartINN, Piccola Arico [name] 12/02/2014, 8:21 AM

## 2014-12-02 NOTE — Progress Notes (Signed)
Post Partum Day 2 Subjective: no complaints, up ad lib, voiding, tolerating PO, + flatus and bottle feeding  Objective: Blood pressure 151/84, pulse 87, temperature 97.8 F (36.6 C), temperature source Oral, resp. rate 20, height 5\' 2"  (1.575 m), weight 125.646 kg (277 lb), last menstrual period 02/23/2014, SpO2 95 %, unknown if currently breastfeeding.  Physical Exam:  General: alert, cooperative, appears stated age and no distress Lochia: appropriate Uterine Fundus: firm Incision: healing well, no significant drainage, no dehiscence, no significant erythema DVT Evaluation: No evidence of DVT seen on physical exam. Negative Homan's sign. No cords or calf tenderness.   Recent Labs  11/29/14 1940 11/30/14 0530  HGB 12.9 12.1  HCT 37.5 35.4*    Assessment/Plan: Patient would like to go home today.  I am ok with her going home but the Pediatricians may not discharge baby.  If Pecola LeisureBaby is discharged will discharge Mother today with incision check in office 2 weeks.  And post partum visit 4-6 weeks.    LOS: 3 days   Kelissa Merlin [name] 12/02/2014, 8:20 AM

## 2014-12-28 ENCOUNTER — Other Ambulatory Visit: Payer: Self-pay | Admitting: Obstetrics & Gynecology

## 2015-03-18 ENCOUNTER — Other Ambulatory Visit: Payer: Self-pay | Admitting: Obstetrics and Gynecology

## 2015-03-22 LAB — CYTOLOGY - PAP

## 2015-04-18 ENCOUNTER — Other Ambulatory Visit: Payer: Self-pay

## 2016-05-27 ENCOUNTER — Other Ambulatory Visit: Payer: Self-pay | Admitting: Family Medicine

## 2016-05-27 DIAGNOSIS — Z975 Presence of (intrauterine) contraceptive device: Secondary | ICD-10-CM | POA: Insufficient documentation

## 2016-08-29 ENCOUNTER — Emergency Department (HOSPITAL_COMMUNITY)
Admission: EM | Admit: 2016-08-29 | Discharge: 2016-08-29 | Disposition: A | Discharge status: Home / Self Care | Payer: BLUE CROSS/BLUE SHIELD | Source: Home / Self Care | Attending: Emergency Medicine | Admitting: Emergency Medicine

## 2016-08-29 ENCOUNTER — Encounter (HOSPITAL_COMMUNITY): Payer: Self-pay

## 2016-08-29 ENCOUNTER — Emergency Department (HOSPITAL_COMMUNITY)
Admission: EM | Admit: 2016-08-29 | Discharge: 2016-08-29 | Disposition: A | Discharge status: Home / Self Care | Payer: BLUE CROSS/BLUE SHIELD | Attending: Emergency Medicine | Admitting: Emergency Medicine

## 2016-08-29 ENCOUNTER — Telehealth (HOSPITAL_BASED_OUTPATIENT_CLINIC_OR_DEPARTMENT_OTHER): Payer: Self-pay | Admitting: Emergency Medicine

## 2016-08-29 ENCOUNTER — Encounter (HOSPITAL_COMMUNITY): Payer: Self-pay | Admitting: Emergency Medicine

## 2016-08-29 DIAGNOSIS — Z79899 Other long term (current) drug therapy: Secondary | ICD-10-CM | POA: Insufficient documentation

## 2016-08-29 DIAGNOSIS — R110 Nausea: Secondary | ICD-10-CM | POA: Insufficient documentation

## 2016-08-29 DIAGNOSIS — R040 Epistaxis: Secondary | ICD-10-CM | POA: Insufficient documentation

## 2016-08-29 DIAGNOSIS — R51 Headache: Secondary | ICD-10-CM | POA: Insufficient documentation

## 2016-08-29 DIAGNOSIS — R519 Headache, unspecified: Secondary | ICD-10-CM

## 2016-08-29 LAB — CBC
HCT: 34.9 % — ABNORMAL LOW (ref 36.0–46.0)
HEMOGLOBIN: 11.5 g/dL — AB (ref 12.0–15.0)
MCH: 29.6 pg (ref 26.0–34.0)
MCHC: 33 g/dL (ref 30.0–36.0)
MCV: 89.9 fL (ref 78.0–100.0)
Platelets: 225 10*3/uL (ref 150–400)
RBC: 3.88 MIL/uL (ref 3.87–5.11)
RDW: 13.8 % (ref 11.5–15.5)
WBC: 7.7 10*3/uL (ref 4.0–10.5)

## 2016-08-29 MED ORDER — IBUPROFEN 800 MG PO TABS
800.0000 mg | ORAL_TABLET | Freq: Once | ORAL | Status: AC
  Administered 2016-08-29: 800 mg via ORAL
  Filled 2016-08-29: qty 1

## 2016-08-29 MED ORDER — HYDROCODONE-ACETAMINOPHEN 5-325 MG PO TABS: 1.0000 | ORAL_TABLET | Freq: Four times a day (QID) | ORAL | 0 refills | Status: DC | PRN

## 2016-08-29 MED ORDER — SILVER NITRATE-POT NITRATE 75-25 % EX MISC
1.0000 "application " | Freq: Once | CUTANEOUS | Status: AC
  Administered 2016-08-29: 1 via TOPICAL
  Filled 2016-08-29: qty 1

## 2016-08-29 MED ORDER — ONDANSETRON 8 MG PO TBDP
8.0000 mg | ORAL_TABLET | Freq: Once | ORAL | Status: AC
  Administered 2016-08-29: 8 mg via ORAL
  Filled 2016-08-29: qty 1

## 2016-08-29 MED ORDER — ONDANSETRON 4 MG PO TBDP: 4.0000 mg | ORAL_TABLET | Freq: Three times a day (TID) | ORAL | 0 refills | Status: DC | PRN

## 2016-08-29 MED ORDER — LIDOCAINE VISCOUS 2 % MT SOLN
15.0000 mL | Freq: Once | OROMUCOSAL | Status: AC
  Administered 2016-08-29: 15 mL via OROMUCOSAL
  Filled 2016-08-29: qty 15

## 2016-08-29 MED ORDER — PHENYLEPHRINE HCL 0.25 % NA SOLN
2.0000 | NASAL | Status: AC
Start: 2016-08-29 — End: 2016-08-29
  Administered 2016-08-29: 2 via NASAL
  Filled 2016-08-29: qty 15

## 2016-08-29 MED ORDER — IBUPROFEN 400 MG PO TABS: 400.0000 mg | ORAL_TABLET | Freq: Four times a day (QID) | ORAL | 0 refills | Status: DC | PRN

## 2016-08-29 MED ORDER — CEPHALEXIN 500 MG PO CAPS: 500.0000 mg | ORAL_CAPSULE | Freq: Four times a day (QID) | ORAL | 0 refills | Status: DC

## 2016-08-29 MED ORDER — LORAZEPAM 1 MG PO TABS
1.0000 mg | ORAL_TABLET | Freq: Once | ORAL | Status: AC
  Administered 2016-08-29: 1 mg via ORAL
  Filled 2016-08-29: qty 1

## 2016-08-29 NOTE — ED Notes (Signed)
PT tearful and Dr.Horton at bedside gave verbal order for Ativan 1mg  PO for anxiety due to procedures completed.

## 2016-08-29 NOTE — ED Notes (Signed)
Verbal order for Afrin per Dr. Wilkie AyeHorton due to nose bleed

## 2016-08-29 NOTE — Discharge Instructions (Signed)
Your headache and nausea is likely related to discomfort from the rhino rocket. Please call Dr. Raye SorrowWolicki's office tomorrow at 9:00 AM for reevaluation and possible removal of the rhino rockets, although it is recommended to keep in place for 5 days.

## 2016-08-29 NOTE — Discharge Instructions (Signed)
You were seen today for nosebleed. You required packing. Maintain packing in place and 3 follow-up with ENT. You'll be given pain medication. You will also be given antibiotics. If your nosebleed recurs, you need to be reevaluated immediately.

## 2016-08-29 NOTE — ED Provider Notes (Signed)
[facility]-EMERGENCY DEPT Provider Note   CSN: [id number] Arrival date & time: 08/29/16  1451     History   Chief Complaint Chief Complaint  Patient presents with  . Headache  . Nausea    HPI Meghan Griffin is a 35 y.o. female.  Patient is 35 yo F with noncontributory PMH, d/c from ED this morning after rhino rocket placed to right nostril to control epistaxis, now presenting with dull headache and nausea, requesting removal of rhino rocket. She attributes headache and nausea due to discomfort of having rhino rocket in nose. Denies any continued bleeding, sensation of passing clots down her throat, trouble swallowing, or difficulty breathing. She has been eating and drinking, and tried Percocet to relieve her dull frontal headache, but it did not help and made nausea worse. She denies any vomiting or abdominal pain. Called to schedule f/u appointment with ENT, but states they could not see her until Monday 11/13.      Past Medical History:  Diagnosis Date  . Eczema   . Hemorrhoids   . History of gonorrhea 2006   treated  . Obesity     Patient Active Problem List   Diagnosis Date Noted  . IUD (intrauterine device) in place 05/27/2016  . S/P cesarean section 11/30/2014  . Active labor at term 11/29/2014  . [redacted] weeks gestation of pregnancy   . Decreased amniotic fluid   . [redacted] weeks gestation of pregnancy   . Obesity complicating pregnancy   . Encounter for ultrasound to check fetal growth   . Prediabetes   . Obesity     Past Surgical History:  Procedure Laterality Date  . CESAREAN SECTION N/A 11/30/2014   Procedure: CESAREAN SECTION;  Surgeon: Essie HartWalda Pinn, MD;  Location: [facility];  Service: Obstetrics;  Laterality: N/A;  . TOOTH EXTRACTION      OB History    Gravida Para Term Preterm AB Living   1 1 1     1    SAB TAB Ectopic Multiple Live Births         0 1       Home Medications    Prior to Admission medications   Medication Sig Start Date End Date Taking?  Authorizing Provider  HYDROcodone-acetaminophen (NORCO/VICODIN) 5-325 MG tablet Take 1 tablet by mouth every 6 (six) hours as needed. Patient taking differently: Take 1 tablet by mouth every 6 (six) hours as needed for moderate pain.  08/29/16  Yes Shon Batonourtney F Horton, MD  cephALEXin (KEFLEX) 500 MG capsule Take 1 capsule (500 mg total) by mouth 4 (four) times daily. Patient not taking: Reported on 08/29/2016 08/29/16   Shon Batonourtney F Horton, MD  ibuprofen (ADVIL,MOTRIN) 400 MG tablet Take 1 tablet (400 mg total) by mouth every 6 (six) hours as needed for headache or moderate pain. 08/29/16   Arianne Klinge F de Villier II, PA  ondansetron (ZOFRAN ODT) 4 MG disintegrating tablet Take 1 tablet (4 mg total) by mouth every 8 (eight) hours as needed for nausea or vomiting. 08/29/16   Ido Wollman F de Villier II, PA    Family History Family History  Problem Relation Age of Onset  . Alcohol abuse Father   . COPD Father   . Hyperlipidemia Father   . CAD Father [id number]    MI  . Stroke Father [id number]  . Hypertension Father   . Diabetes Father   . Hyperlipidemia Maternal Grandmother   . Hypertension Maternal Grandmother   . Hyperlipidemia Maternal Grandfather   . Hypertension  Maternal Grandfather   . Cancer Other     great grandmother - bone marrow  . Cancer Paternal Aunt     leukemia    Social History Social History  Substance Use Topics  . Smoking status: Never Smoker  . Smokeless tobacco: Never Used  . Alcohol use No     Allergies   Amoxicillin and Other   Review of Systems Review of Systems  Constitutional: Negative for appetite change.  HENT: Positive for nosebleeds (now controlled). Negative for postnasal drip, sinus pain, sinus pressure, sore throat and trouble swallowing.   Respiratory: Negative for shortness of breath.   Gastrointestinal: Positive for nausea. Negative for abdominal pain and vomiting.  Genitourinary: Negative for flank pain and hematuria.  Neurological: Positive for headaches. Negative  for dizziness.     Physical Exam Updated Vital Signs BP 132/72 (BP Location: Left Arm)   Pulse 95   Temp 97.9 F (36.6 C) (Oral)   Resp 18   Wt 135.2 kg   SpO2 99%   BMI 45.31 kg/m   Physical Exam  Constitutional:  Obese female, rhino rocket noted to right nare, no acute distress.  HENT:  Head: Normocephalic and atraumatic.  Mouth/Throat: Oropharynx is clear and moist.  Rhino rocket noted to right nostril. No nasal mucosal edema, erythema, or bleeding noted to left nostril. No frontal or maxillary sinus tenderness. Uvula midline, no oropharyngeal exudate, erythema, or edema.  Eyes: Conjunctivae are normal.  Neck: Normal range of motion.  Cardiovascular: Normal rate.   Pulmonary/Chest: Effort normal. No respiratory distress.  Musculoskeletal: Normal range of motion.  Neurological: She is alert.  Skin: Skin is warm and dry.  Psychiatric: She has a normal mood and affect.  Nursing note and vitals reviewed.    ED Treatments / Results  Labs (all labs ordered are listed, but only abnormal results are displayed) Labs Reviewed - No data to display  EKG  EKG Interpretation None       Radiology No results found.  Procedures Procedures (including critical care time)  Medications Ordered in ED Medications  ondansetron (ZOFRAN-ODT) disintegrating tablet 8 mg (8 mg Oral Given 08/29/16 1700)  ibuprofen (ADVIL,MOTRIN) tablet 800 mg (800 mg Oral Given 08/29/16 1701)     Initial Impression / Assessment and Plan / ED Course  I have reviewed the triage vital signs and the nursing notes.  Pertinent labs & imaging results that were available during my care of the patient were reviewed by me and considered in my medical decision making (see chart for details).  Clinical Course    Patient is 35 yo F, d/c from ED this morning after rhino rocket placed to right nostril to control epistaxis, now presenting with dull headache and nausea, requesting removal of rhino rocket. She  scheduled f/u with ENT on 11/13, but states she cannot wait that long. She is in no acute distress, oxygenating well, with no nasal or oropharyngeal edema. Given Zofran ODT and ibuprofen, which improved nausea and headache. Called ENT for earlier appointment, and they will try to see her tomorrow morning for reevaluation. Prescription Zofran and ibuprofen provided, with instructions to call ENT to schedule appointment tomorrow. Agreeable to keep rhino rocket in place this evening. Stable for d/c home with return precautions as noted in d/c summary.  Final Clinical Impressions(s) / ED Diagnoses   Final diagnoses:  Nausea  Bad headache    New Prescriptions New Prescriptions   ONDANSETRON (ZOFRAN ODT) 4 MG DISINTEGRATING TABLET    Take  1 tablet (4 mg total) by mouth every 8 (eight) hours as needed for nausea or vomiting.     Ivonne AndrewDaryl F de RevereVillier II, PA 08/29/16 1820    Azalia BilisKevin Campos, MD 08/29/16 [phone]

## 2016-08-29 NOTE — ED Notes (Signed)
Dr.Horton at bedside to evaluate pt. Pt still having active bleeding from both nares with no relief from direct pressure.

## 2016-08-29 NOTE — ED Provider Notes (Signed)
[facility]-EMERGENCY DEPT Provider Note   CSN: [id number] Arrival date & time: 08/29/16  0126  By signing my name below, I, Alyssa Grove, attest that this documentation has been prepared under the direction and in the presence of Shon Baton, MD. Electronically Signed: Alyssa Grove, ED Scribe. 08/29/16. 2:35 AM.   History   Chief Complaint Chief Complaint  Patient presents with  . Epistaxis   The history is provided by the patient. No language interpreter was used.   HPI Comments: Meghan Griffin is a 35 y.o. female who presents to the Emergency Department complaining of sudden onset, constant epistaxis onset 9:30 PM. Pt has tried holding her nose and ice pack with no relief. She states she blew her nose which started the nose bleed. She states bleeding is occurring from both nostrils. She has had occasional nose bleeds, but never to this severity. Pt has not broken her nose before. She denies recent cough, cold symptoms or rhinorrhea.Deniespicking. Denies cocaine use Pt denies use of blood thinners.   Past Medical History:  Diagnosis Date  . Eczema   . Hemorrhoids   . History of gonorrhea 2006   treated  . Obesity     Patient Active Problem List   Diagnosis Date Noted  . IUD (intrauterine device) in place 05/27/2016  . S/P cesarean section 11/30/2014  . Active labor at term 11/29/2014  . [redacted] weeks gestation of pregnancy   . Decreased amniotic fluid   . [redacted] weeks gestation of pregnancy   . Obesity complicating pregnancy   . Encounter for ultrasound to check fetal growth   . Prediabetes   . Obesity     Past Surgical History:  Procedure Laterality Date  . CESAREAN SECTION N/A 11/30/2014   Procedure: CESAREAN SECTION;  Surgeon: Essie Hart, MD;  Location: [facility];  Service: Obstetrics;  Laterality: N/A;  . TOOTH EXTRACTION      OB History    Gravida Para Term Preterm AB Living   1 1 1     1    SAB TAB Ectopic Multiple Live Births         0 1       Home Medications     Prior to Admission medications   Medication Sig Start Date End Date Taking? Authorizing Provider  ibuprofen (ADVIL,MOTRIN) 200 MG tablet Take 400 mg by mouth every 6 (six) hours as needed for moderate pain.   Yes Historical Provider, MD  cephALEXin (KEFLEX) 500 MG capsule Take 1 capsule (500 mg total) by mouth 4 (four) times daily. 08/29/16   Shon Baton, MD  HYDROcodone-acetaminophen (NORCO/VICODIN) 5-325 MG tablet Take 1 tablet by mouth every 6 (six) hours as needed. 08/29/16   Shon Baton, MD  ibuprofen (ADVIL,MOTRIN) 600 MG tablet Take 1 tablet (600 mg total) by mouth every 6 (six) hours as needed for moderate pain. Patient not taking: Reported on 08/29/2016 12/02/14   Essie Hart, MD  oxyCODONE-acetaminophen (PERCOCET/ROXICET) 5-325 MG per tablet Take 1-2 tablets by mouth every 4 (four) hours as needed (for pain scale equal to or greater than 7). Patient not taking: Reported on 08/29/2016 12/02/14   Essie Hart, MD    Family History Family History  Problem Relation Age of Onset  . Alcohol abuse Father   . COPD Father   . Hyperlipidemia Father   . CAD Father 42    MI  . Stroke Father 32  . Hypertension Father   . Diabetes Father   . Hyperlipidemia Maternal  Grandmother   . Hypertension Maternal Grandmother   . Hyperlipidemia Maternal Grandfather   . Hypertension Maternal Grandfather   . Cancer Other     great grandmother - bone marrow  . Cancer Paternal Aunt     leukemia    Social History Social History  Substance Use Topics  . Smoking status: Never Smoker  . Smokeless tobacco: Never Used  . Alcohol use No     Allergies   Other and Amoxicillin   Review of Systems Review of Systems  Constitutional: Negative for fever.  HENT: Positive for ear pain and nosebleeds. Negative for rhinorrhea and sore throat.   Respiratory: Negative for cough.   All other systems reviewed and are negative.  Physical Exam Updated Vital Signs BP 143/82 (BP Location: Left Arm)    Pulse 93   Temp 97.8 F (36.6 C) (Oral)   Resp 20   Ht 5\' 8"  (1.727 m)   Wt 277 lb (125.6 kg)   SpO2 98%   BMI 42.12 kg/m   Physical Exam  Constitutional: She is oriented to person, place, and time. She appears well-developed and well-nourished.  HENT:  Head: Normocephalic and atraumatic.  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  Cardiovascular: Normal rate and regular rhythm.   Pulmonary/Chest: Effort normal. No respiratory distress.  Abdominal: Soft. There is no tenderness.  Neurological: She is alert and oriented to person, place, and time.  Skin: Skin is warm and dry.  Psychiatric: She has a normal mood and affect.  Nursing note and vitals reviewed.    ED Treatments / Results  DIAGNOSTIC STUDIES: Oxygen Saturation is 96% on RA, adequate by my interpretation.    COORDINATION OF CARE: 2:30 AM Discussed treatment plan with pt at bedside which includes lab work and observation and pt agreed to plan.  Labs (all labs ordered are listed, but only abnormal results are displayed) Labs Reviewed  CBC - Abnormal; Notable for the following:       Result Value   Hemoglobin 11.5 (*)    HCT 34.9 (*)    All other components within normal limits    EKG  EKG Interpretation None       Radiology No results found.  Procedures .Epistaxis Management Date/Time: 08/29/2016 5:46 AM Performed by: Shon BatonHORTON, COURTNEY F Authorized by: Shon BatonHORTON, COURTNEY F   Consent:    Consent obtained:  Verbal   Consent given by:  Patient Anesthesia (see MAR for exact dosages):    Anesthesia method:  Topical application   Topical anesthetic:  Lidocaine gel Procedure details:    Treatment site:  R anterior and R posterior   Treatment method:  Nasal balloon   Treatment complexity:  Extensive   Treatment episode: initial    Post-procedure details:    Assessment:  Bleeding stopped   Patient tolerance of procedure:  Tolerated well, no immediate complications Comments:     Initially, patient blew clots and aspirin was applied. Continue to  Ooze.  Patient failed anterior pack. Rhino Rocket with double balloon was placed with cessation of bleeding.   (including critical care time)  Medications Ordered in ED Medications  phenylephrine (NEO-SYNEPHRINE) 0.25 % nasal spray 2 spray (2 sprays Each Nare Given 08/29/16 0230)  silver nitrate applicators applicator 1 application (1 application Topical Given 08/29/16 0245)  lidocaine (XYLOCAINE) 2 % viscous  mouth solution 15 mL (15 mLs Mouth/Throat Given 08/29/16 0345)  LORazepam (ATIVAN) tablet 1 mg (1 mg Oral Given 08/29/16 0421)     Initial Impression / Assessment and Plan / ED Course  I have reviewed the triage vital signs and the nursing notes.  Pertinent labs & imaging results that were available during my care of the patient were reviewed by me and considered in my medical decision making (see chart for details).  Clinical Course as of Aug 29 542  Wed Aug 29, 2016  0311 Pt's nose began bleeding again after cautery with silver nitrate  [MG]    Clinical Course User Index [MG] Alyssa GroveMartin Green   Patient presents with epistaxis.  Unable to visualize source of bleeding. She appears to have a septal defect. She categorically denies cocaine use or prior trauma.  I was able to successfully pack the patient's nare without consultation. She was monitored on telemetry for greater than one hour without any recurrence of bleeding or complications. Discussed with Dr. Merceda ElksByer who agrees patient can be discharged home with close ENT follow-up. Patient will be started on Keflex. Norco for pain. ENT follow-up for packing removal and further evaluation.  After history, exam, and medical workup I feel the patient has been appropriately medically screened and is safe for discharge home.  Pertinent diagnoses were discussed with the patient. Patient was given return precautions.   Final Clinical Impressions(s) / ED Diagnoses   Final diagnoses:  Epistaxis    New Prescriptions New Prescriptions   CEPHALEXIN (KEFLEX) 500 MG CAPSULE    Take 1 capsule (500 mg total) by mouth 4 (four) times daily.   HYDROCODONE-ACETAMINOPHEN (NORCO/VICODIN) 5-325 MG TABLET    Take 1 tablet by mouth every 6 (six) hours as needed.     Shon Batonourtney F Horton, MD 08/29/16 [id number]

## 2016-08-29 NOTE — ED Triage Notes (Signed)
Pt c/o headache and nausea r/t rhino rocket placed in R nare this morning.  Pain score 10/10.  Per chart review, Pt was seen this morning for epistaxis and after several other interventions had to have the rhino rocket placed.  Pt called ENT, but can not be seen until Monday.  Pt reports "my head hurts, I'm nauseous, and I feel like I'm going to pass out.  I just want this out.  I don't care if I bleed."   Sts she took prescribed pain medication w/o relief.

## 2016-08-29 NOTE — ED Triage Notes (Signed)
Pt states her nose started bleeding about 9pm tonight and she has not been able to get it to stop since

## 2016-08-29 NOTE — ED Notes (Signed)
Pt laying in bed eyes closed no acute distress. No active bleeding at this time. Awaiting consult with ENT.

## 2016-08-29 NOTE — ED Notes (Signed)
ED Provider at bedside. 

## 2016-08-29 NOTE — ED Notes (Signed)
Dr.Horton at bedside to reapply rhino rocket due to contious bleeding from both nares. Pt alert and oriented x 4 maintaining airway without difficulty. Pt continues to suction mouth with Wynonia SoursYanker

## 2016-08-30 ENCOUNTER — Encounter: Payer: Self-pay | Admitting: Family Medicine

## 2016-08-30 ENCOUNTER — Telehealth: Payer: Self-pay | Admitting: Family Medicine

## 2016-08-30 ENCOUNTER — Ambulatory Visit (INDEPENDENT_AMBULATORY_CARE_PROVIDER_SITE_OTHER): Payer: BLUE CROSS/BLUE SHIELD | Admitting: Family Medicine

## 2016-08-30 VITALS — BP 118/76 | HR 94 | Temp 97.7°F | Wt 295.0 lb

## 2016-08-30 DIAGNOSIS — Z5321 Procedure and treatment not carried out due to patient leaving prior to being seen by health care provider: Secondary | ICD-10-CM

## 2016-08-30 NOTE — Telephone Encounter (Signed)
[facility] scheduled 30 min appt 08/30/16 at 1 pm with Roddie McJulia Kordsmeier FNP. [facility]

## 2016-08-30 NOTE — Telephone Encounter (Signed)
Patient Name: Catarina HartshornATALIE Boule DOB: 06-10-1981 Initial Comment Caller was in ER twice yesterday with nose bleed. Still in pain. Nurse Assessment Nurse: Leveda AnnaHensel, RN, Aeriel Date/Time (Eastern Time): 08/30/2016 11:42:28 AM Confirm and document reason for call. If symptomatic, describe symptoms. You must click the next button to save text entered. ---Caller states, she was in the ER twice yesterday for a nosebleed twice. Caller states, they finally put in a rhino rocket. It has stopped the bleeding but it is causing a lot of pain. They put it in at 5am she went back at 430 yesterday and told them she was in a lot of pain. Caller states, they would not remove it she would have to follow up with her PCP or ENT. The ent will not remove it until Monday. Has the patient traveled out of the country within the last 30 days? ---Not Applicable Does the patient have any new or worsening symptoms? ---Yes Will a triage be completed? ---Yes Related visit to physician within the last 2 weeks? ---Yes Does the PT have any chronic conditions? (i.e. diabetes, asthma, etc.) ---No Is the patient pregnant or possibly pregnant? (Ask all females between the ages of 6312-55) ---No Is this a behavioral health or substance abuse call? ---No Guidelines Guideline Title Affirmed Question Affirmed Notes Nosebleed Patient sounds very sick or weak to the triager Final Disposition User Go to ED Now (or PCP triage) Leveda AnnaHensel, RN, Aeriel Comments Nurse attempted to schedule pt an appt at PCP unable to. [facility] [place] [facility] for 1 pm. Referrals GO TO FACILITY OTHER - SPECIFY Disagree/Comply: Comply

## 2016-08-30 NOTE — Progress Notes (Signed)
Pre visit review using our clinic review tool, if applicable. No additional management support is needed unless otherwise documented below in the visit note. 

## 2016-08-31 ENCOUNTER — Encounter: Payer: Self-pay | Admitting: Family Medicine

## 2016-08-31 ENCOUNTER — Ambulatory Visit (INDEPENDENT_AMBULATORY_CARE_PROVIDER_SITE_OTHER): Payer: BLUE CROSS/BLUE SHIELD | Admitting: Family Medicine

## 2016-08-31 VITALS — BP 130/72 | HR 88 | Temp 97.6°F | Wt 298.0 lb

## 2016-08-31 DIAGNOSIS — R040 Epistaxis: Secondary | ICD-10-CM

## 2016-08-31 NOTE — Progress Notes (Signed)
   Subjective:    Patient ID: Meghan Griffin, female    DOB: [birth year], 35 y.o.   MRN: [id number]  HPI This is a 35 yo female who presents today for follow up of nose bleed. She was seen two days ago in ED with a severe nose bleed. A Rhino Rocket was placed. She was told that it had to stay in for 48 hours and then could come out. She has been having congestion and difficulty with pain and odor. Feels like she is coughing and choking. No fever for last two days. Feels nauseous. Poor sleep for 2 nights. Has never had nose bleeds before. Felt fine prior. No history of excessive bleeding. Has IUD, not menstruating.   Past Medical History:  Diagnosis Date  . Eczema   . Hemorrhoids   . History of gonorrhea 2006   treated  . Obesity    Past Surgical History:  Procedure Laterality Date  . CESAREAN SECTION N/A 11/30/2014   Procedure: CESAREAN SECTION;  Surgeon: Essie HartWalda Pinn, MD;  Location: [facility];  Service: Obstetrics;  Laterality: N/A;  . TOOTH EXTRACTION     Family History  Problem Relation Age of Onset  . Alcohol abuse Father   . COPD Father   . Hyperlipidemia Father   . CAD Father [id number]    MI  . Stroke Father [id number]  . Hypertension Father   . Diabetes Father   . Hyperlipidemia Maternal Grandmother   . Hypertension Maternal Grandmother   . Hyperlipidemia Maternal Grandfather   . Hypertension Maternal Grandfather   . Cancer Other     great grandmother - bone marrow  . Cancer Paternal Aunt     leukemia   Social History  Substance Use Topics  . Smoking status: Never Smoker  . Smokeless tobacco: Never Used  . Alcohol use No      Review of Systems Per HPI    Objective:   Physical Exam  Constitutional: She is oriented to person, place, and time. She appears well-developed and well-nourished. No distress.  HENT:  Head: Normocephalic and atraumatic.  Right nare with Rhino Rocket intact. Air removed from device and device removed slowly. No bleeding noted. Patient reports relief.     Eyes: Conjunctivae are normal.  Cardiovascular: Normal rate.   Pulmonary/Chest: Effort normal.  Neurological: She is alert and oriented to person, place, and time.  Skin: Skin is warm and dry. She is not diaphoretic.  Psychiatric: She has a normal mood and affect. Her behavior is normal. Judgment and thought content normal.  Vitals reviewed.     BP 130/72 (BP Location: Left Arm, Patient Position: Sitting, Cuff Size: Normal)   Pulse 88   Temp 97.6 F (36.4 C) (Oral)   Wt 298 lb (135.2 kg)   SpO2 97%   BMI 45.31 kg/m  Wt Readings from Last 3 Encounters:  08/31/16 298 lb (135.2 kg)  08/30/16 295 lb (133.8 kg)  08/29/16 298 lb (135.2 kg)   Had patient remain in exam room for 15 minutes following removal of Rhino Rocket. She had very small amount bloody drainage.     Assessment & Plan:  1. Epistaxis - Provided written and verbal information regarding diagnosis and treatment. - RTC/ER precautions reviewed - she was instructed to avoid blowing nose, bending over, lifting    Olean Reeeborah Bradyn Vassey, FNP-BC  [facility], [facility]  09/01/2016 5:52 AM

## 2016-08-31 NOTE — Progress Notes (Signed)
This visit was cancelled as patient was scheduled for a visit on 08/31/16 [facility]

## 2016-08-31 NOTE — Patient Instructions (Signed)
Avoid forcefully blowing nose  Nosebleed Nosebleeds are common. They are due to a crack in the inside lining of your nose (mucous membrane) or from a small blood vessel that starts to bleed. Nosebleeds can be caused by many conditions, such as injury, infections, dry mucous membranes or dry climate, medicines, nose picking, and home heating and cooling systems. Most nosebleeds come from blood vessels in the front of your nose. HOME CARE INSTRUCTIONS   Try controlling your nosebleed by pinching your nostrils gently and continuously for at least 10 minutes.  Avoid blowing or sniffing your nose for a number of hours after having a nosebleed.  Do not put gauze inside your nose yourself. If your nose was packed by your health care provider, try to maintain the pack inside of your nose until your health care provider removes it.  If a gauze pack was used and it starts to fall out, gently replace it or cut off the end of it.  If a balloon catheter was used to pack your nose, do not cut or remove it unless your health care provider has instructed you to do that.  Avoid lying down while you are having a nosebleed. Sit up and lean forward.  Use a nasal spray decongestant to help with a nosebleed as directed by your health care provider.  Do not use petroleum jelly or mineral oil in your nose. These can drip into your lungs.  Maintain humidity in your home by using less air conditioning or by using a humidifier.  Aspirinand blood thinners make bleeding more likely. If you are prescribed these medicines and you suffer from nosebleeds, ask your health care provider if you should stop taking the medicines or adjust the dose. Do not stop medicines unless directed by your health care provider  Resume your normal activities as you are able, but avoid straining, lifting, or bending at the waist for several days.  If your nosebleed was caused by dry mucous membranes, use over-the-counter saline nasal spray  or gel. This will keep the mucous membranes moist and allow them to heal. If you must use a lubricant, choose the water-soluble variety. Use it only sparingly, and do not use it within several hours of lying down.  Keep all follow-up visits as directed by your health care provider. This is important. SEEK MEDICAL CARE IF:  You have a fever.  You get frequent nosebleeds.  You are getting nosebleeds more often. SEEK IMMEDIATE MEDICAL CARE IF:  Your nosebleed lasts longer than 20 minutes.  Your nosebleed occurs after an injury to your face, and your nose looks crooked or broken.  You have unusual bleeding from other parts of your body.  You have unusual bruising on other parts of your body.  You feel light-headed or you faint.  You become sweaty.  You vomit blood.  Your nosebleed occurs after a head injury.   This information is not intended to replace advice given to you by your health care provider. Make sure you discuss any questions you have with your health care provider.   Document Released: 07/18/2005 Document Revised: 10/29/2014 Document Reviewed: 05/24/2014 Elsevier Interactive Patient Education [employer]2016 Elsevier Inc.

## 2016-09-17 IMAGING — US US OB FOLLOW-UP
1 series · 12 of 28 positions shown · non-contrast
Comparison: none

[Series 1: us ob follow-up · 0.19mm/px · 12 of 42 slices shown]
[im 2/42]
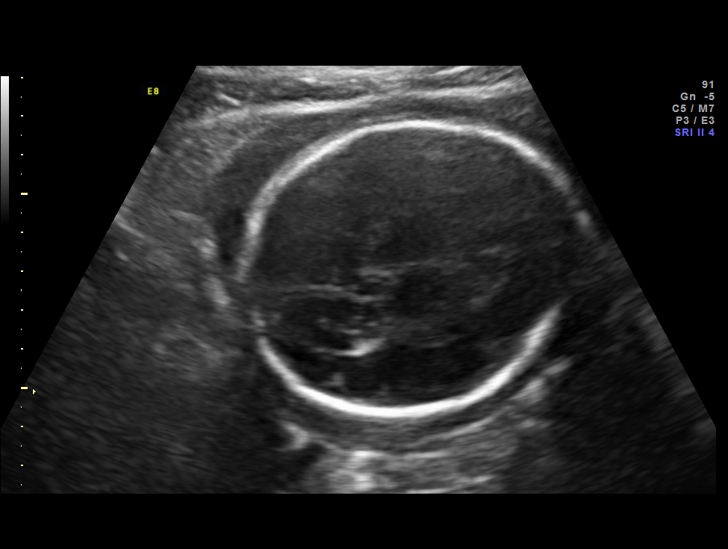
[im 5/42]
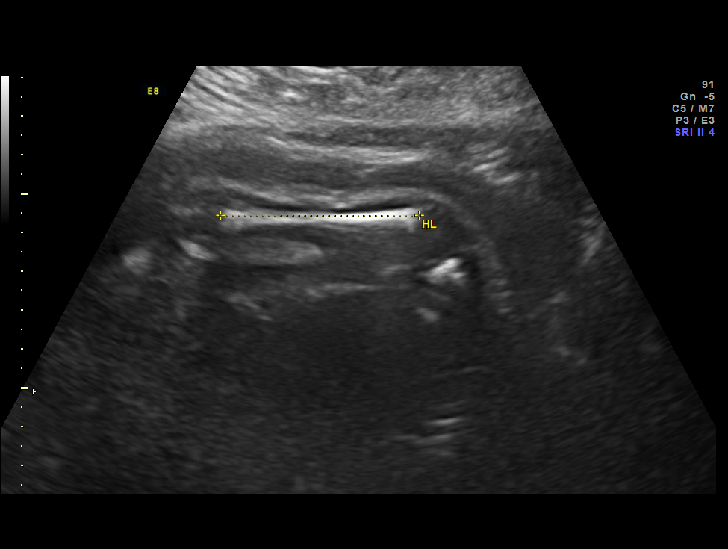
[im 8/42]
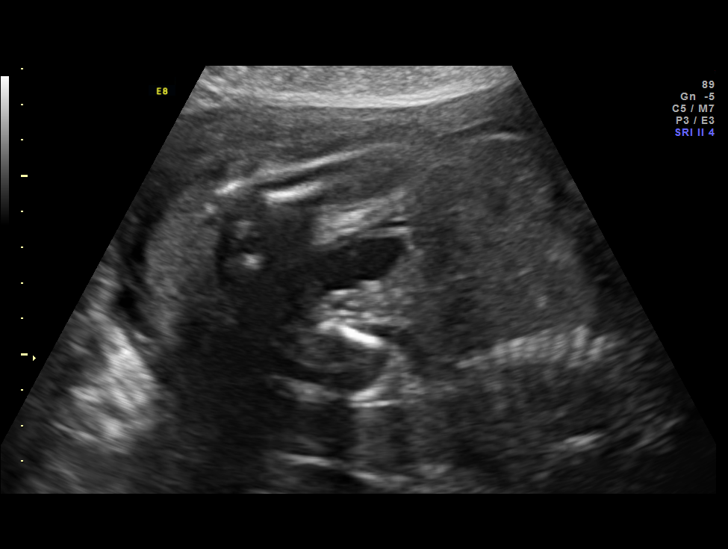
[im 13/42]
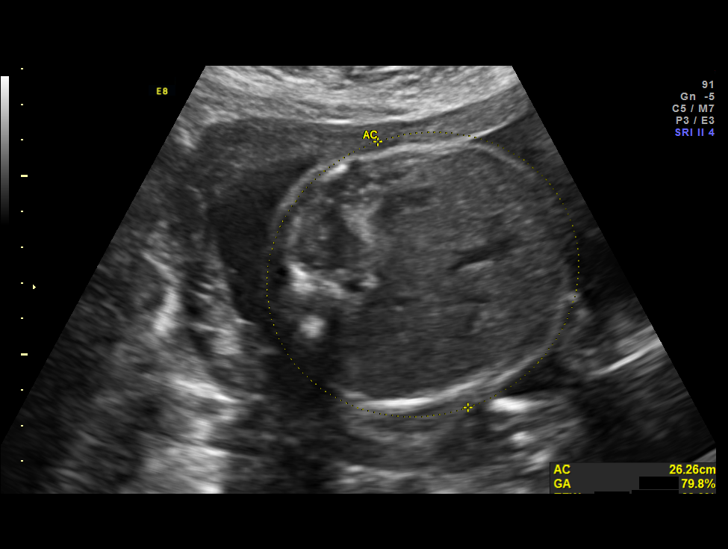
[im 16/42]
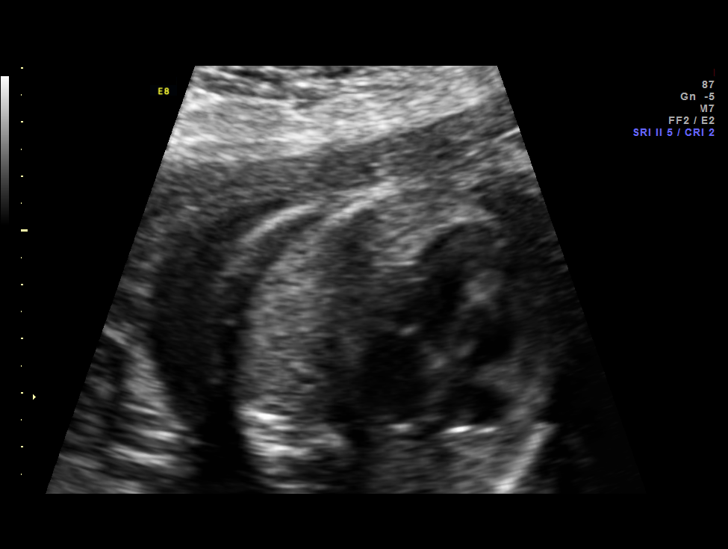
[im 19/42]
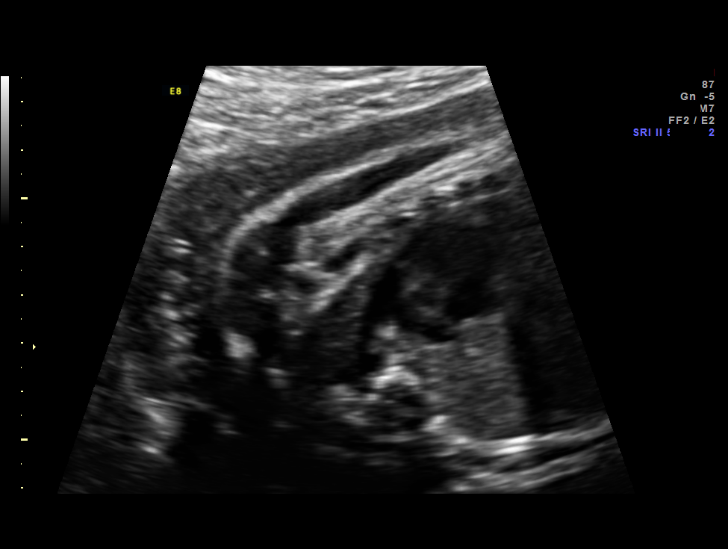
[im 23/42]
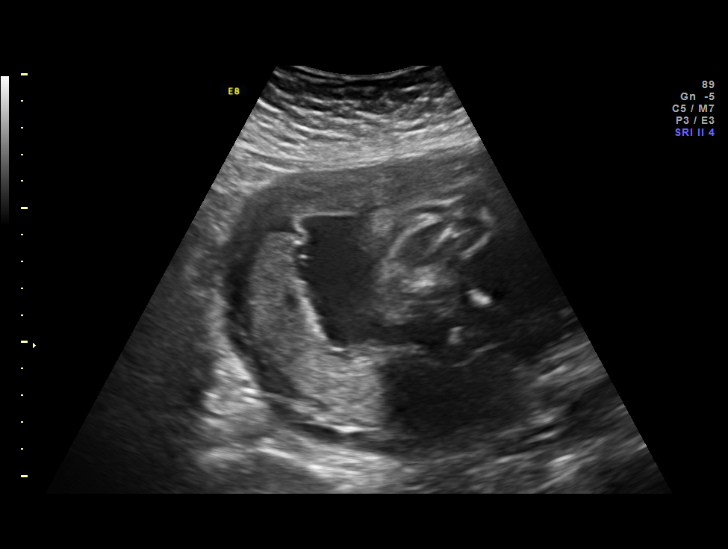
[im 26/42]
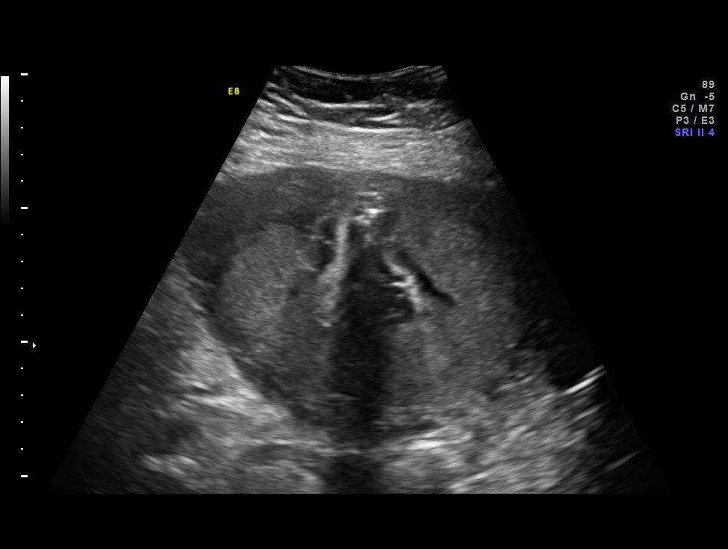
[im 29/42]
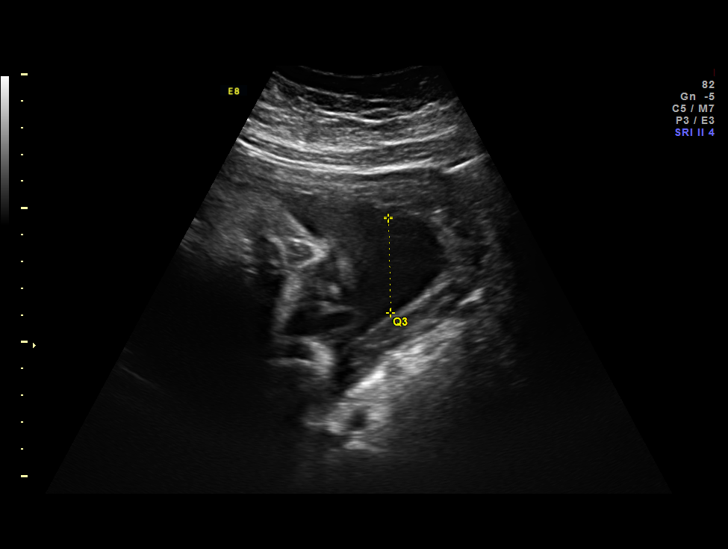
[im 34/42]
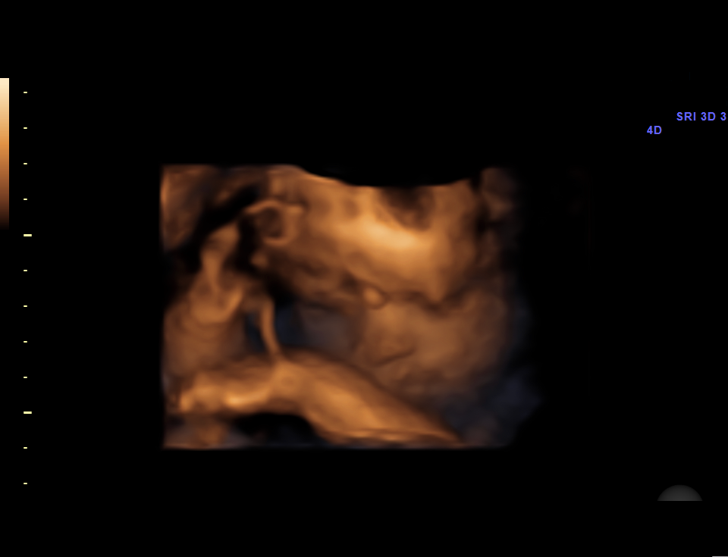
[im 37/42]
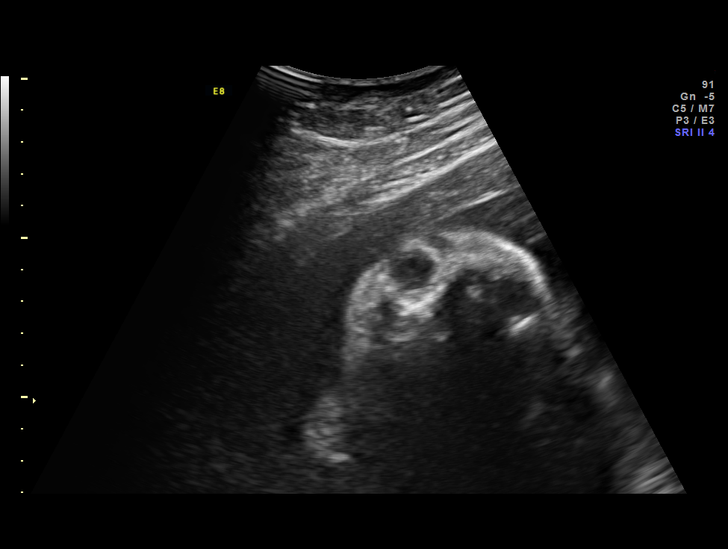
[im 40/42]
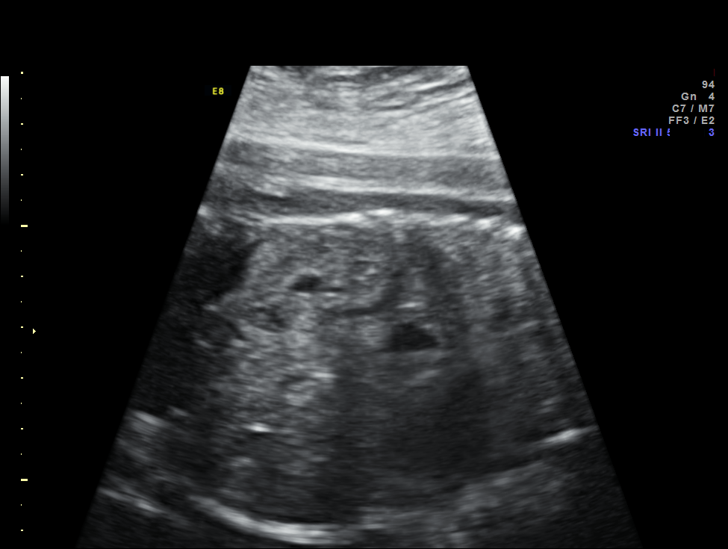

[12 of 28 positions shown; findings below may reference images not displayed]

OBSTETRICS REPORT
                    (Corrected Final 10/02/2014 [DATE])

Service(s) Provided

 US OB FOLLOW UP                                       76816.1
Indications

 Maternal morbid obesity - 744lbs
 Follow-up incomplete fetal anatomic evaluation        Z36
 29 weeks gestation of pregnancy
Fetal Evaluation

 Num Of Fetuses:    1
 Fetal Heart Rate:  131                          bpm
 Cardiac Activity:  Observed
 Presentation:      Cephalic
 Placenta:          Left lateral, above cervical
                    os
 P. Cord            Not well visualized
 Insertion:

 Amniotic Fluid
 AFI FV:      Subjectively within normal limits
 AFI Sum:     11.09   cm       21  %Tile
Biometry

 BPD:     72.1  mm     G. Age:  29w 0d                CI:        73.94   70 - 86
                                                      FL/HC:      21.3   19.6 -

 HC:     266.3  mm     G. Age:  29w 0d       16   %   HC/AC:      1.00   0.99 -

 AC:     265.5  mm     G. Age:  30w 5d       84   %   FL/BPD:     78.8   71 - 87
 FL:      56.8  mm     G. Age:  29w 6d       55   %   FL/AC:      21.4   20 - 24
 HUM:     51.3  mm     G. Age:  30w 0d       63   %

 Est. FW:    1215   gm     3 lb 5 oz     70  %
Gestational Age

 Clinical EDD:  29w 1d                                        EDD:   12/09/14
 U/S Today:     29w 4d                                        EDD:   12/06/14
 Best:          29w 1d     Det. By:  Clinical EDD             EDD:   12/09/14
Anatomy
 Cranium:          Previously seen        Aortic Arch:      Previously seen
 Fetal Cavum:      Previously seen        Ductal Arch:      Not well visualized
 Ventricles:       Appears normal         Diaphragm:        Previously seen
 Choroid Plexus:   Previously seen        Stomach:          Appears normal, left
                                                            sided
 Cerebellum:       Previously seen        Abdomen:          Previously seen
 Posterior Fossa:  Previously seen        Abdominal Wall:   Previously seen
 Nuchal Fold:      Previously seen        Cord Vessels:     Previously seen
 Face:             Appears normal         Kidneys:          Appear normal
                   (orbits and profile)
 Lips:             Appears normal         Bladder:          Appears normal
 Heart:            Appears normal         Spine:            Previously seen
                   (4CH, axis, and
                   situs)
 RVOT:             Appears normal         Lower             Previously seen
                                          Extremities:
 LVOT:             Appears normal         Upper             Previously seen
                                          Extremities:

 Other:  Heels previously seen.
Cervix Uterus Adnexa

 Cervix:       Not visualized (advanced GA >36wks)
Impression

 SIUP at 22+1 weeks
 Normal detailed fetal anatomy; limited views of profile and
 heart completed today oher than ductal arch
 Normal amniotic fluid volume
 Measurements consistent with stated EDC; EFW at the 70th
 %tile

 Report generated for Dr. Don Lolito as system was down at time
 of ultrasound
Recommendations

 Follow-up ultrasounds as clinically indicated.

 questions or concerns.
                       Amazigh, Quirijn

## 2018-05-23 DIAGNOSIS — R7309 Other abnormal glucose: Secondary | ICD-10-CM | POA: Insufficient documentation

## 2019-11-30 ENCOUNTER — Other Ambulatory Visit: Payer: Self-pay

## 2019-11-30 ENCOUNTER — Ambulatory Visit: Payer: Self-pay

## 2020-02-15 LAB — HM PAP SMEAR: HPV, high-risk: NEGATIVE

## 2022-07-19 ENCOUNTER — Other Ambulatory Visit (HOSPITAL_COMMUNITY): Payer: Self-pay

## 2022-07-19 MED ORDER — INFLUENZA VAC SPLIT QUAD 0.5 ML IM SUSY
0.5000 mL | PREFILLED_SYRINGE | Freq: Once | INTRAMUSCULAR | 0 refills | Status: AC
  Filled 2022-07-19: qty 0.5, 1d supply, fill #0

## 2022-08-20 ENCOUNTER — Other Ambulatory Visit (HOSPITAL_COMMUNITY): Payer: Self-pay

## 2022-08-20 MED ORDER — ROSUVASTATIN CALCIUM 10 MG PO TABS
10.0000 mg | ORAL_TABLET | Freq: Every day | ORAL | 0 refills | Status: DC
  Filled 2022-08-20: qty 90, 90d supply, fill #0

## 2022-08-20 MED ORDER — ESCITALOPRAM OXALATE 10 MG PO TABS
10.0000 mg | ORAL_TABLET | Freq: Every day | ORAL | 1 refills | Status: DC
  Filled 2022-08-20 – 2023-03-05 (×2): qty 90, 90d supply, fill #0

## 2022-08-20 MED ORDER — LISDEXAMFETAMINE DIMESYLATE 30 MG PO CAPS
30.0000 mg | ORAL_CAPSULE | ORAL | 0 refills | Status: DC
  Filled 2022-08-20: qty 30, 30d supply, fill #0

## 2022-08-21 ENCOUNTER — Other Ambulatory Visit (HOSPITAL_COMMUNITY): Payer: Self-pay

## 2022-08-21 MED ORDER — LISDEXAMFETAMINE DIMESYLATE 30 MG PO CAPS
30.0000 mg | ORAL_CAPSULE | Freq: Every morning | ORAL | 0 refills | Status: DC
  Filled 2022-08-21 – 2022-10-01 (×2): qty 30, 30d supply, fill #0

## 2022-08-28 ENCOUNTER — Other Ambulatory Visit (HOSPITAL_COMMUNITY): Payer: Self-pay

## 2022-10-01 ENCOUNTER — Other Ambulatory Visit (HOSPITAL_COMMUNITY): Payer: Self-pay

## 2022-10-02 ENCOUNTER — Other Ambulatory Visit (HOSPITAL_COMMUNITY): Payer: Self-pay

## 2022-10-02 MED ORDER — LISDEXAMFETAMINE DIMESYLATE 30 MG PO CAPS
30.0000 mg | ORAL_CAPSULE | Freq: Every morning | ORAL | 0 refills | Status: DC
  Filled 2022-10-02 (×2): qty 30, 30d supply, fill #0

## 2022-10-03 ENCOUNTER — Other Ambulatory Visit (HOSPITAL_COMMUNITY): Payer: Self-pay

## 2022-11-16 ENCOUNTER — Other Ambulatory Visit (HOSPITAL_COMMUNITY): Payer: Self-pay

## 2022-11-17 ENCOUNTER — Other Ambulatory Visit (HOSPITAL_COMMUNITY): Payer: Self-pay

## 2022-11-19 ENCOUNTER — Other Ambulatory Visit (HOSPITAL_COMMUNITY): Payer: Self-pay

## 2022-11-19 MED ORDER — LISDEXAMFETAMINE DIMESYLATE 30 MG PO CAPS
ORAL_CAPSULE | ORAL | 0 refills | Status: DC
  Filled 2022-11-19: qty 30, 30d supply, fill #0

## 2022-12-26 ENCOUNTER — Other Ambulatory Visit (HOSPITAL_COMMUNITY): Payer: Self-pay

## 2022-12-26 ENCOUNTER — Ambulatory Visit: Payer: 59 | Admitting: Nurse Practitioner

## 2022-12-26 ENCOUNTER — Encounter: Payer: Self-pay | Admitting: Nurse Practitioner

## 2022-12-26 VITALS — BP 152/92 | HR 95 | Temp 98.8°F | Ht 68.0 in | Wt 321.0 lb

## 2022-12-26 DIAGNOSIS — R7303 Prediabetes: Secondary | ICD-10-CM | POA: Diagnosis not present

## 2022-12-26 DIAGNOSIS — E782 Mixed hyperlipidemia: Secondary | ICD-10-CM | POA: Diagnosis not present

## 2022-12-26 DIAGNOSIS — Z1159 Encounter for screening for other viral diseases: Secondary | ICD-10-CM | POA: Diagnosis not present

## 2022-12-26 DIAGNOSIS — Z87892 Personal history of anaphylaxis: Secondary | ICD-10-CM | POA: Diagnosis not present

## 2022-12-26 DIAGNOSIS — Z79899 Other long term (current) drug therapy: Secondary | ICD-10-CM | POA: Diagnosis not present

## 2022-12-26 DIAGNOSIS — M79672 Pain in left foot: Secondary | ICD-10-CM | POA: Diagnosis not present

## 2022-12-26 DIAGNOSIS — R030 Elevated blood-pressure reading, without diagnosis of hypertension: Secondary | ICD-10-CM | POA: Diagnosis not present

## 2022-12-26 DIAGNOSIS — F5081 Binge eating disorder: Secondary | ICD-10-CM | POA: Diagnosis not present

## 2022-12-26 DIAGNOSIS — Z6841 Body Mass Index (BMI) 40.0 and over, adult: Secondary | ICD-10-CM

## 2022-12-26 MED ORDER — EPINEPHRINE 0.3 MG/0.3ML IJ SOAJ
0.3000 mg | INTRAMUSCULAR | 2 refills | Status: DC | PRN
  Filled 2022-12-26: qty 2, 2d supply, fill #0
  Filled 2023-12-18: qty 2, 2d supply, fill #1

## 2022-12-26 MED ORDER — SAXENDA 18 MG/3ML ~~LOC~~ SOPN
3.0000 mg | PEN_INJECTOR | Freq: Every day | SUBCUTANEOUS | 1 refills | Status: DC
  Filled 2022-12-26 – 2022-12-28 (×2): qty 15, 30d supply, fill #0

## 2022-12-26 NOTE — Progress Notes (Signed)
I,Meghan Griffin,acting as a Education administrator for Meghan Brine, FNP.,have documented all relevant documentation on the behalf of Meghan Brine, FNP,as directed by  Meghan Brine, FNP while in the presence of Meghan Griffin, Meghan Griffin.   Subjective:     Patient ID: Meghan Griffin , female    DOB: July 18, 1981 , 42 y.o.   MRN: [id number]   Chief Complaint  Patient presents with   Establish Care   Foot Pain    Left, outside area of foot, after being on feet a long time   Rash    To forehead area, dry/flaky; hx of eczema as a child    HPI  Patient presents today to establish care. She was going to [facility] prior to coming here with her last visit in October. 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 Single she has an 82 y/o daughter and a dog. She found [place] on the website.   Patient is needing to change providers due to insurance. Patient was seen by previous PCP in September 2023.  Family history of hyperlipidemia. Maternal grandmother and her father. Aunt lung cancer with history of smoking. Brother - hyperlipidemia  She is having pain to her left foot lateral when walking. Denies injuries or falls she wears tennis shoes or crocs.  She was being treated for binge eating and she noticed while taking vyvanse she was able to focus. She has taken phentermine - she has taken more than 6 months. She is not exercising. Sausage, 2 pieces of bread and 2 boiled eggs, has not eaten since breakfast. Yesterday bowl grits, luncheable cheese and salsa, dinner potato salad and fried chicken. Sometimes snack. She is trying to drink more water. Which has been "sort of helping"   She has never taken metformin      Past Medical History:  Diagnosis Date   Eczema    Hemorrhoids    History of gonorrhea 2006   treated   Obesity      Family History  Problem Relation Age of Onset   Alcohol abuse Father    COPD Father    Hyperlipidemia Father    CAD Father 55        MI   Stroke Father 9   Hypertension Father    Diabetes Father    Hyperlipidemia Maternal Grandmother    Hypertension Maternal Grandmother    Hyperlipidemia Maternal Grandfather    Hypertension Maternal Grandfather    Cancer Other        great grandmother - bone marrow   Cancer Paternal Aunt        leukemia     Current Outpatient Medications:    B Complex-C (SUPER B COMPLEX PO), Take by mouth., Disp: , Rfl:    EPINEPHrine (EPIPEN 2-PAK) 0.3 mg/0.3 mL IJ SOAJ injection, Inject 0.3 mg into the muscle as needed for anaphylaxis., Disp: 2 each, Rfl: 2   escitalopram (LEXAPRO) 10 MG tablet, Take 1 tablet (10 mg total) by mouth daily., Disp: 90 tablet, Rfl: 1   Liraglutide -Weight Management (SAXENDA) 18 MG/3ML SOPN, Inject 3 mg into the skin daily., Disp: 15 mL, Rfl: 1   rosuvastatin (CRESTOR) 10 MG tablet, Take 1 tablet (10 mg total) by mouth at bedtime., Disp: 90 tablet, Rfl: 0   Vitamin D, Ergocalciferol, (DRISDOL) 1.25 MG (50000 UNIT) CAPS capsule, Take 50,000 Units by mouth every 7 (seven) days., Disp: , Rfl:    Allergies  Allergen Reactions  Amoxicillin Diarrhea    Has patient had a PCN reaction causing immediate rash, facial/tongue/throat swelling, SOB or lightheadedness with hypotension: no Has patient had a PCN reaction causing severe rash involving mucus membranes or skin necrosis: no Has patient had a PCN reaction that required hospitalization: no Has patient had a PCN reaction occurring within the last 10 years: yes If all of the above answers are "NO", then may proceed with Cephalosporin use.    Justicia Adhatoda Anaphylaxis   Other Anaphylaxis and Swelling    Tree nuts   Shellfish-Derived Products Anaphylaxis    GI upset-lobster and crab     Review of Systems  Constitutional: Negative.  Negative for activity change and fatigue.  Eyes:  Negative for visual disturbance.  Respiratory: Negative.  Negative for choking, shortness of breath, wheezing and stridor.    Cardiovascular: Negative.  Negative for chest pain, palpitations and leg swelling.  Gastrointestinal: Negative.   Endocrine: Negative.  Negative for polydipsia, polyphagia and polyuria.  Musculoskeletal: Negative.   Skin:  Positive for rash.  Neurological:  Negative for dizziness, weakness and headaches.  Psychiatric/Behavioral: Negative.  Negative for confusion. The patient is not nervous/anxious.      Today's Vitals   12/26/22 1537 12/26/22 1625  BP: (!) 148/88 (!) 152/92  Pulse: 95   Temp: 98.8 F (37.1 C)   TempSrc: Oral   SpO2: 96%   Weight: (!) 321 lb (145.6 kg)   Height: 5\' 8"  (1.727 m)    Body mass index is 48.81 kg/m.   Objective:  Physical Exam Vitals reviewed.  Constitutional:      General: She is not in acute distress.    Appearance: Normal appearance. She is well-developed. She is obese.  Cardiovascular:     Rate and Rhythm: Normal rate and regular rhythm.     Pulses: Normal pulses.     Heart sounds: Normal heart sounds. No murmur heard. Pulmonary:     Effort: Pulmonary effort is normal. No respiratory distress.     Breath sounds: Normal breath sounds. No wheezing.  Chest:     Chest wall: No tenderness.  Musculoskeletal:        General: Normal range of motion.  Skin:    General: Skin is warm and dry.     Capillary Refill: Capillary refill takes less than 2 seconds.  Neurological:     General: No focal deficit present.     Mental Status: She is alert and oriented to person, place, and time.     Cranial Nerves: No cranial nerve deficit.  Psychiatric:        Mood and Affect: Mood normal.        Behavior: Behavior normal.        Thought Content: Thought content normal.        Judgment: Judgment normal.         Assessment And Plan:     1. Prediabetes Will check HgbA1c, encouraged to limit intake of sugary foods and drinks.  - Hemoglobin A1c  2. Mixed hyperlipidemia - Lipid panel  3. Binge eating disorder Comments: She had been taking Vyvanse  previously and noticed it helped with her focus. - Liraglutide -Weight Management (SAXENDA) 18 MG/3ML SOPN; Inject 3 mg into the skin daily.  Dispense: 15 mL; Refill: 1  4. History of anaphylaxis - EPINEPHrine (EPIPEN 2-PAK) 0.3 mg/0.3 mL IJ SOAJ injection; Inject 0.3 mg into the muscle as needed for anaphylaxis.  Dispense: 2 each; Refill: 2  5. Left foot pain  6. Elevated blood pressure reading in office without diagnosis of hypertension Comments: Blood pressure is elevated, repeat is slightly improved. She is to follow a low salt diet  7. BMI 45.0-49.9, adult (HCC) Chronic Discussed healthy diet and regular exercise options  Encouraged to exercise at least 150 minutes per week with 2 days of strength training Will start [place] pending insurance approval she is to titrate weekly, discussed side effects of nausea, abdominal pain or difficulty swallowing to notify office. Saxenda teaching done once she picks up the medication  Return in 2 months for weight check. - Liraglutide -Weight Management (SAXENDA) 18 MG/3ML SOPN; Inject 3 mg into the skin daily.  Dispense: 15 mL; Refill: 1 - TSH  8. Encounter for hepatitis C screening test for low risk patient Will check Hepatitis C screening due to recent recommendations to screen all adults 18 years and older - Hepatitis C antibody  9. Other long term (current) drug therapy - TSH   Patient was given opportunity to ask questions. Patient verbalized understanding of the plan and was able to repeat key elements of the plan. All questions were answered to their satisfaction.   Meghan Brine, FNP    I, Meghan Brine, FNP, have reviewed all documentation for this visit. The documentation on 12/26/22 for the exam, diagnosis, procedures, and orders are all accurate and complete.  THE PATIENT IS ENCOURAGED TO PRACTICE SOCIAL DISTANCING DUE TO THE COVID-19 PANDEMIC.

## 2022-12-27 LAB — HEMOGLOBIN A1C
Est. average glucose Bld gHb Est-mCnc: 123 mg/dL
Hgb A1c MFr Bld: 5.9 % — ABNORMAL HIGH (ref 4.8–5.6)

## 2022-12-27 LAB — TSH: TSH: 2.3 u[IU]/mL (ref 0.450–4.500)

## 2022-12-27 LAB — LIPID PANEL
Chol/HDL Ratio: 5.3 ratio — ABNORMAL HIGH (ref 0.0–4.4)
Cholesterol, Total: 231 mg/dL — ABNORMAL HIGH (ref 100–199)
HDL: 44 mg/dL (ref >39)
LDL Chol Calc (NIH): 172 mg/dL — ABNORMAL HIGH (ref 0–99)
Triglycerides: 83 mg/dL (ref 0–149)
VLDL Cholesterol Cal: 15 mg/dL (ref 5–40)

## 2022-12-27 LAB — HEPATITIS C ANTIBODY: Hep C Virus Ab: NONREACTIVE

## 2022-12-28 ENCOUNTER — Other Ambulatory Visit: Payer: Self-pay

## 2022-12-28 ENCOUNTER — Other Ambulatory Visit (HOSPITAL_COMMUNITY): Payer: Self-pay

## 2023-01-01 ENCOUNTER — Other Ambulatory Visit (HOSPITAL_COMMUNITY): Payer: Self-pay

## 2023-01-01 ENCOUNTER — Ambulatory Visit: Payer: 59

## 2023-01-01 VITALS — BP 128/80 | HR 98 | Temp 98.7°F | Ht 68.0 in | Wt 321.0 lb

## 2023-01-01 DIAGNOSIS — R030 Elevated blood-pressure reading, without diagnosis of hypertension: Secondary | ICD-10-CM

## 2023-01-01 NOTE — Progress Notes (Signed)
Patient presents today for  a BP check, patient is not currently on any medications.  BP Readings from Last 3 Encounters:  01/01/23 132/80  12/26/22 (!) 152/92  08/31/16 130/72  Per provider - we will not start a medication at this time but she needs to make sure she is not eating high salt foods, and take a magnesium gluconate supplement

## 2023-01-01 NOTE — Patient Instructions (Signed)
Hypertension, Adult ?Hypertension is another name for high blood pressure. High blood pressure forces your heart to work harder to pump blood. This can cause problems over time. ?There are two numbers in a blood pressure reading. There is a top number (systolic) over a bottom number (diastolic). It is best to have a blood pressure that is below 120/80. ?What are the causes? ?The cause of this condition is not known. Some other conditions can lead to high blood pressure. ?What increases the risk? ?Some lifestyle factors can make you more likely to develop high blood pressure: ?Smoking. ?Not getting enough exercise or physical activity. ?Being overweight. ?Having too much fat, sugar, calories, or salt (sodium) in your diet. ?Drinking too much alcohol. ?Other risk factors include: ?Having any of these conditions: ?Heart disease. ?Diabetes. ?High cholesterol. ?Kidney disease. ?Obstructive sleep apnea. ?Having a family history of high blood pressure and high cholesterol. ?Age. The risk increases with age. ?Stress. ?What are the signs or symptoms? ?High blood pressure may not cause symptoms. Very high blood pressure (hypertensive crisis) may cause: ?Headache. ?Fast or uneven heartbeats (palpitations). ?Shortness of breath. ?Nosebleed. ?Vomiting or feeling like you may vomit (nauseous). ?Changes in how you see. ?Very bad chest pain. ?Feeling dizzy. ?Seizures. ?How is this treated? ?This condition is treated by making healthy lifestyle changes, such as: ?Eating healthy foods. ?Exercising more. ?Drinking less alcohol. ?Your doctor may prescribe medicine if lifestyle changes do not help enough and if: ?Your top number is above 130. ?Your bottom number is above 80. ?Your personal target blood pressure may vary. ?Follow these instructions at home: ?Eating and drinking ? ?If told, follow the DASH eating plan. To follow this plan: ?Fill one half of your plate at each meal with fruits and vegetables. ?Fill one fourth of your plate  at each meal with whole grains. Whole grains include whole-wheat pasta, brown rice, and whole-grain bread. ?Eat or drink low-fat dairy products, such as skim milk or low-fat yogurt. ?Fill one fourth of your plate at each meal with low-fat (lean) proteins. Low-fat proteins include fish, chicken without skin, eggs, beans, and tofu. ?Avoid fatty meat, cured and processed meat, or chicken with skin. ?Avoid pre-made or processed food. ?Limit the amount of salt in your diet to less than 1,500 mg each day. ?Do not drink alcohol if: ?Your doctor tells you not to drink. ?You are pregnant, may be pregnant, or are planning to become pregnant. ?If you drink alcohol: ?Limit how much you have to: ?0-1 drink a day for women. ?0-2 drinks a day for men. ?Know how much alcohol is in your drink. In the [place] one drink equals one 12 oz bottle of beer (355 mL), one 5 oz glass of wine (148 mL), or one 1? oz glass of hard liquor (44 mL). ?Lifestyle ? ?Work with your doctor to stay at a healthy weight or to lose weight. Ask your doctor what the best weight is for you. ?Get at least 30 minutes of exercise that causes your heart to beat faster (aerobic exercise) most days of the week. This may include walking, swimming, or biking. ?Get at least 30 minutes of exercise that strengthens your muscles (resistance exercise) at least 3 days a week. This may include lifting weights or doing Pilates. ?Do not smoke or use any products that contain nicotine or tobacco. If you need help quitting, ask your doctor. ?Check your blood pressure at home as told by your doctor. ?Keep all follow-up visits. ?Medicines ?Take over-the-counter and prescription medicines   only as told by your doctor. Follow directions carefully. ?Do not skip doses of blood pressure medicine. The medicine does not work as well if you skip doses. Skipping doses also puts you at risk for problems. ?Ask your doctor about side effects or reactions to medicines that you should watch  for. ?Contact a doctor if: ?You think you are having a reaction to the medicine you are taking. ?You have headaches that keep coming back. ?You feel dizzy. ?You have swelling in your ankles. ?You have trouble with your vision. ?Get help right away if: ?You get a very bad headache. ?You start to feel mixed up (confused). ?You feel weak or numb. ?You feel faint. ?You have very bad pain in your: ?Chest. ?Belly (abdomen). ?You vomit more than once. ?You have trouble breathing. ?These symptoms may be an emergency. Get help right away. Call 911. ?Do not wait to see if the symptoms will go away. ?Do not drive yourself to the hospital. ?Summary ?Hypertension is another name for high blood pressure. ?High blood pressure forces your heart to work harder to pump blood. ?For most people, a normal blood pressure is less than 120/80. ?Making healthy choices can help lower blood pressure. If your blood pressure does not get lower with healthy choices, you may need to take medicine. ?This information is not intended to replace advice given to you by your health care provider. Make sure you discuss any questions you have with your health care provider. ?Document Revised: 07/27/2021 Document Reviewed: 07/27/2021 ?Elsevier Patient Education ? 2023 Elsevier Inc. ? ?

## 2023-01-07 ENCOUNTER — Other Ambulatory Visit: Payer: Self-pay | Admitting: Nurse Practitioner

## 2023-01-07 ENCOUNTER — Other Ambulatory Visit (HOSPITAL_BASED_OUTPATIENT_CLINIC_OR_DEPARTMENT_OTHER): Payer: Self-pay

## 2023-01-07 ENCOUNTER — Encounter: Payer: Self-pay | Admitting: Nurse Practitioner

## 2023-01-07 MED ORDER — ROSUVASTATIN CALCIUM 10 MG PO TABS
10.0000 mg | ORAL_TABLET | Freq: Every day | ORAL | 1 refills | Status: DC
  Filled 2023-01-07 – 2023-05-29 (×2): qty 90, 90d supply, fill #0

## 2023-01-07 NOTE — Telephone Encounter (Signed)
I don't see a PA in CMM from my sign in.  [place] - can you please check with your log in? Thanks!

## 2023-01-08 ENCOUNTER — Other Ambulatory Visit (HOSPITAL_COMMUNITY): Payer: Self-pay

## 2023-01-10 ENCOUNTER — Other Ambulatory Visit: Payer: Self-pay | Admitting: Nurse Practitioner

## 2023-01-10 ENCOUNTER — Other Ambulatory Visit (HOSPITAL_BASED_OUTPATIENT_CLINIC_OR_DEPARTMENT_OTHER): Payer: Self-pay

## 2023-01-10 DIAGNOSIS — F5081 Binge eating disorder: Secondary | ICD-10-CM

## 2023-01-10 MED ORDER — LISDEXAMFETAMINE DIMESYLATE 30 MG PO CAPS
30.0000 mg | ORAL_CAPSULE | Freq: Every morning | ORAL | 0 refills | Status: DC
  Filled 2023-01-10 – 2023-01-14 (×2): qty 30, 30d supply, fill #0

## 2023-01-10 NOTE — Telephone Encounter (Signed)
She is on [employer] so Kirke Shaggy will not be covered after 01/21/23  I do not see any information on a PA in CoverMyMeds, it would require a PA to be completed.

## 2023-01-14 ENCOUNTER — Other Ambulatory Visit (HOSPITAL_BASED_OUTPATIENT_CLINIC_OR_DEPARTMENT_OTHER): Payer: Self-pay

## 2023-01-21 ENCOUNTER — Other Ambulatory Visit (HOSPITAL_BASED_OUTPATIENT_CLINIC_OR_DEPARTMENT_OTHER): Payer: Self-pay

## 2023-03-05 ENCOUNTER — Other Ambulatory Visit: Payer: Self-pay | Admitting: Nurse Practitioner

## 2023-03-05 ENCOUNTER — Other Ambulatory Visit (HOSPITAL_COMMUNITY): Payer: Self-pay

## 2023-03-05 ENCOUNTER — Encounter: Payer: Self-pay | Admitting: Nurse Practitioner

## 2023-03-05 ENCOUNTER — Encounter: Payer: Self-pay | Admitting: Pharmacist

## 2023-03-05 ENCOUNTER — Other Ambulatory Visit: Payer: Self-pay

## 2023-03-05 ENCOUNTER — Other Ambulatory Visit (HOSPITAL_BASED_OUTPATIENT_CLINIC_OR_DEPARTMENT_OTHER): Payer: Self-pay

## 2023-03-05 DIAGNOSIS — F5081 Binge eating disorder: Secondary | ICD-10-CM

## 2023-03-05 MED ORDER — LISDEXAMFETAMINE DIMESYLATE 30 MG PO CAPS
30.0000 mg | ORAL_CAPSULE | Freq: Every morning | ORAL | 0 refills | Status: DC
  Filled 2023-03-05: qty 26, 26d supply, fill #0
  Filled 2023-03-05: qty 30, 30d supply, fill #0

## 2023-03-06 ENCOUNTER — Other Ambulatory Visit (HOSPITAL_BASED_OUTPATIENT_CLINIC_OR_DEPARTMENT_OTHER): Payer: Self-pay

## 2023-03-07 ENCOUNTER — Other Ambulatory Visit (HOSPITAL_BASED_OUTPATIENT_CLINIC_OR_DEPARTMENT_OTHER): Payer: Self-pay

## 2023-04-10 ENCOUNTER — Encounter: Payer: Self-pay | Admitting: Nurse Practitioner

## 2023-04-15 ENCOUNTER — Encounter: Payer: Self-pay | Admitting: Nurse Practitioner

## 2023-04-17 ENCOUNTER — Encounter: Payer: Self-pay | Admitting: Family Medicine

## 2023-04-17 ENCOUNTER — Other Ambulatory Visit (HOSPITAL_COMMUNITY): Payer: Self-pay

## 2023-04-17 ENCOUNTER — Ambulatory Visit: Payer: 59 | Admitting: Family Medicine

## 2023-04-17 VITALS — BP 118/82 | HR 88 | Temp 98.1°F | Ht 68.0 in | Wt 324.4 lb

## 2023-04-17 DIAGNOSIS — R21 Rash and other nonspecific skin eruption: Secondary | ICD-10-CM | POA: Diagnosis not present

## 2023-04-17 DIAGNOSIS — Z6841 Body Mass Index (BMI) 40.0 and over, adult: Secondary | ICD-10-CM

## 2023-04-17 DIAGNOSIS — L743 Miliaria, unspecified: Secondary | ICD-10-CM | POA: Diagnosis not present

## 2023-04-17 DIAGNOSIS — L740 Miliaria rubra: Secondary | ICD-10-CM

## 2023-04-17 DIAGNOSIS — F5081 Binge eating disorder: Secondary | ICD-10-CM

## 2023-04-17 DIAGNOSIS — F50819 Binge eating disorder, unspecified: Secondary | ICD-10-CM | POA: Insufficient documentation

## 2023-04-17 MED ORDER — TOPIRAMATE 50 MG PO TABS
50.0000 mg | ORAL_TABLET | Freq: Every day | ORAL | 2 refills | Status: DC
Start: 2023-04-17 — End: 2023-06-25
  Filled 2023-04-17: qty 30, 30d supply, fill #0

## 2023-04-17 MED ORDER — NYSTATIN 100000 UNIT/GM EX POWD
1.0000 | Freq: Three times a day (TID) | CUTANEOUS | 0 refills | Status: AC
  Filled 2023-04-17: qty 15, 5d supply, fill #0

## 2023-04-17 MED ORDER — TOPAMAX 50 MG PO TABS
50.0000 mg | ORAL_TABLET | Freq: Every day | ORAL | 2 refills | Status: DC
  Filled 2023-04-17: qty 30, 30d supply, fill #0

## 2023-04-17 MED ORDER — GOLD BOND EX POWD
Freq: Two times a day (BID) | CUTANEOUS | 0 refills | Status: DC
  Filled 2023-04-17: qty 113, fill #0

## 2023-04-17 MED ORDER — LISDEXAMFETAMINE DIMESYLATE 30 MG PO CAPS: 30.0000 mg | ORAL_CAPSULE | Freq: Every morning | ORAL | 0 refills | Status: DC

## 2023-04-17 MED ORDER — PALMERS COCOA BUTTER FORMULA EX CREA
100.0000 g | TOPICAL_CREAM | CUTANEOUS | 0 refills | Status: DC
  Filled 2023-04-17: qty 434, 90d supply, fill #0

## 2023-04-17 NOTE — Progress Notes (Addendum)
I,Meghan Griffin CMA, acting as a Neurosurgeon for [employer], NP.,have documented all relevant documentation on the behalf of Meghan Cappiello, NP,as directed by  Meghan Timko Meghan Salisbury, NP while in the presence of Meghan Boxell, NP.  Subjective:  Patient ID: Meghan Griffin , female    DOB: August 09, 1981 , 42 y.o.   MRN: [id number]  Chief Complaint  Patient presents with   Rash    HPI  Patient presents today for rash. She reports the rash is on her face, back, chest, neck.  She admits irritation & itchiness. She admits this rash hurts even when she showers.  At home she has used hydrocortisone, she admits this helps for about a hour then she feels the discomfort again.  She reports no being established with an dermatologist.  She reports having appointment in August to complete PAP.  She would like refill fo Vyvanse sent to [facility] on [address].   Rash This is a new problem. The current episode started in the past 7 days. The problem has been waxing and waning since onset. The rash is diffuse. The rash is characterized by itchiness and dryness. She was exposed to nothing. Past treatments include topical steroids. The treatment provided mild relief. Her past medical history is significant for eczema.     Past Medical History:  Diagnosis Date   Eczema    Hemorrhoids    History of gonorrhea 2006   treated   Obesity      Family History  Problem Relation Age of Onset   Alcohol abuse Father    COPD Father    Hyperlipidemia Father    CAD Father 14       MI   Stroke Father 84   Hypertension Father    Diabetes Father    Hyperlipidemia Maternal Grandmother    Hypertension Maternal Grandmother    Hyperlipidemia Maternal Grandfather    Hypertension Maternal Grandfather    Cancer Other        great grandmother - bone marrow   Cancer Paternal Aunt        leukemia     Current Outpatient Medications:    B Complex-C (SUPER B COMPLEX PO), Take by mouth., Disp: , Rfl:    Emollient (PALMERS COCOA BUTTER  FORMULA) CREA, Apply topically as directed., Disp: 434 g, Rfl: 0   EPINEPHrine (EPIPEN 2-PAK) 0.3 mg/0.3 mL IJ SOAJ injection, Inject 0.3 mg into the muscle as needed for anaphylaxis., Disp: 2 each, Rfl: 2   escitalopram (LEXAPRO) 10 MG tablet, Take 1 tablet (10 mg total) by mouth daily., Disp: 90 tablet, Rfl: 1   Magnesium 250 MG TABS, Take by mouth., Disp: , Rfl:    menthol-zinc oxide (GOLD BOND) powder, Apply topically 2 (two) times daily., Disp: 113 g, Rfl: 0   nystatin (MYCOSTATIN/NYSTOP) powder, Apply 1 Application topically 3 (three) times daily., Disp: 15 g, Rfl: 0   rosuvastatin (CRESTOR) 10 MG tablet, Take 1 tablet (10 mg total) by mouth at bedtime., Disp: 90 tablet, Rfl: 1   topiramate (TOPAMAX) 50 MG tablet, Take 1 tablet (50 mg total) by mouth daily., Disp: 30 tablet, Rfl: 2   Vitamin D, Ergocalciferol, (DRISDOL) 1.25 MG (50000 UNIT) CAPS capsule, Take 50,000 Units by mouth every 7 (seven) days., Disp: , Rfl:    lisdexamfetamine (VYVANSE) 30 MG capsule, Take 1 capsule (30 mg total) by mouth in the morning., Disp: 30 capsule, Rfl: 0   Allergies  Allergen Reactions   Amoxicillin Diarrhea    Has patient had  a PCN reaction causing immediate rash, facial/tongue/throat swelling, SOB or lightheadedness with hypotension: no Has patient had a PCN reaction causing severe rash involving mucus membranes or skin necrosis: no Has patient had a PCN reaction that required hospitalization: no Has patient had a PCN reaction occurring within the last 10 years: yes If all of the above answers are "NO", then may proceed with Cephalosporin use.    Justicia Adhatoda Anaphylaxis   Other Anaphylaxis and Swelling    Tree nuts   Shellfish-Derived Products Anaphylaxis    GI upset-lobster and crab     Review of Systems  Constitutional: Negative.   Respiratory: Negative.    Cardiovascular: Negative.   Skin:  Positive for rash.  Neurological: Negative.   Psychiatric/Behavioral: Negative.        Today's Vitals   04/17/23 1049  BP: 118/82  Pulse: 88  Temp: 98.1 F (36.7 C)  SpO2: 98%  Weight: (!) 324 lb 6.4 oz (147.1 kg)  Height: 5\' 8"  (1.727 m)   Body mass index is 49.32 kg/m.  Wt Readings from Last 3 Encounters:  04/17/23 (!) 324 lb 6.4 oz (147.1 kg)  01/01/23 (!) 321 lb (145.6 kg)  12/26/22 (!) 321 lb (145.6 kg)     Objective:  Physical Exam Constitutional:      Appearance: Normal appearance. She is obese.  Skin:    General: Skin is dry.     Findings: Rash present.  Neurological:     Mental Status: She is alert.         Assessment And Plan:  Binge eating disorder Assessment & Plan: Add Topiramate 50 mg every day to current treatment plan of Vyvanse 30mg  QD  Orders: -     Topiramate; Take 1 tablet (50 mg total) by mouth daily.  Dispense: 30 tablet; Refill: 2  Rash Assessment & Plan: Apply powder to folds e.g breasts,groin areas  Orders: -     Nystatin; Apply 1 Application topically 3 (three) times daily.  Dispense: 15 g; Refill: 0  BMI 45.0-49.9, adult [facility]) Assessment & Plan: She is encouraged to strive for BMI less than 30 to decrease cardiac risk. Advised to aim for at least 150 minutes of exercise per week.    Miliaria Assessment & Plan: Apply to trunk and other affected areas on the body  Orders: -     Gold Bond; Apply topically 2 (two) times daily.  Dispense: 113 g; Refill: 0 -     Palmers Cocoa Butter Formula; Apply topically as directed.  Dispense: 434 g; Refill: 0  She is encouraged to strive for BMI less than 30 to decrease cardiac risk. Advised to aim for at least 150 minutes of exercise per week.    Return in 2 months (on 06/17/2023), or if symptoms worsen or fail to improve, for pre-diabetes .  Patient was given opportunity to ask questions. Patient verbalized understanding of the plan and was able to repeat key elements of the plan. All questions were answered to their satisfaction.  Meghan Guerrieri Meghan Salisbury, NP  I, Meghan Schnackenberg Meghan Salisbury, NP, have  reviewed all documentation for this visit. The documentation on 05/01/23 for the exam, diagnosis, procedures, and orders are all accurate and complete.   IF YOU HAVE BEEN REFERRED TO A SPECIALIST, IT MAY TAKE 1-2 WEEKS TO SCHEDULE/PROCESS THE REFERRAL. IF YOU HAVE NOT HEARD FROM US/SPECIALIST IN TWO WEEKS, PLEASE GIVE [place] A CALL AT [phone].   THE PATIENT IS ENCOURAGED TO PRACTICE SOCIAL DISTANCING DUE TO THE COVID-19  PANDEMIC.

## 2023-04-17 NOTE — Assessment & Plan Note (Addendum)
Apply powder to folds e.g breasts,groin areas

## 2023-04-17 NOTE — Patient Instructions (Signed)
Rash, Adult  A rash is a breakout of spots or blotches on the skin. It can change the way your skin looks and feels. Many things can cause a rash. The goal of treatment is to stop the itching and keep the rash from spreading. Follow these instructions at home: Medicine Take or apply over-the-counter and prescription medicines only as told by your doctor. These may include medicines to treat: Red or swollen skin. Itching. An allergy. Pain. An infection.  Skin care Put a cool, wet cloth on the rash. Do not scratch or rub your skin. Try not to cover the rash. Keep it exposed to air as often as you can. Managing itching and discomfort Avoid hot showers or baths. These can make itching worse. A cold shower may help. Try taking a bath with: Epsom salts. You can get these at your pharmacy or grocery store. Follow the instructions on the package. Baking soda. Pour a small amount into the bath as told by your doctor. Colloidal oatmeal. You can get this at your pharmacy or grocery store. Follow the instructions on the package. Try putting baking soda paste on your skin. Stir water into baking soda until it gets like a paste. Try putting on a lotion to help with itching (calamine lotion). Keep cool. Stay out of the sun. Sweating and being hot can make itching worse. General instructions  Rest as needed. Drink enough fluid to keep your pee (urine) pale yellow. Wear loose-fitting clothes. Avoid scented soaps, detergents, and perfumes. Use gentle soaps, detergents, perfumes, and cosmetics. Avoid the things that cause your rash. Keep a journal to help keep track of what causes your rash. Write down: What you eat. What cosmetics you use. What you drink. What you wear. This includes jewelry. Contact a doctor if: You sweat a lot at night. You pee (urinate) more or less than normal. Your pee is a darker color than normal. Your eyes are sensitive to light. Your skin or the white parts of your  eyes turn yellow. Your skin tingles or is numb. You get painful blisters in your nose or mouth. Your rash does not go away after a few days, or it gets worse. You are more tired than normal. You are more thirsty than normal. You have new or worse symptoms. These may include: Pain in your belly. A fever. Watery poop (diarrhea). Vomiting. Weakness. Weight loss. Get help right away if: You start to feel mixed up (confused). You have a very bad headache or a stiff neck. You have very bad joint pain or stiffness. You get very sleepy or not responsive. You have a seizure. This information is not intended to replace advice given to you by your health care provider. Make sure you discuss any questions you have with your health care provider. Document Revised: 07/27/2022 Document Reviewed: 07/27/2022 Elsevier Patient Education  2024 Elsevier Inc.  

## 2023-04-22 ENCOUNTER — Other Ambulatory Visit: Payer: Self-pay

## 2023-04-22 ENCOUNTER — Encounter: Payer: Self-pay | Admitting: Family Medicine

## 2023-04-22 DIAGNOSIS — F5081 Binge eating disorder: Secondary | ICD-10-CM

## 2023-04-22 NOTE — Assessment & Plan Note (Signed)
She is encouraged to strive for BMI less than 30 to decrease cardiac risk. Advised to aim for at least 150 minutes of exercise per week.  

## 2023-04-22 NOTE — Assessment & Plan Note (Signed)
Apply to trunk and other affected areas on the body

## 2023-04-22 NOTE — Assessment & Plan Note (Signed)
Add Topiramate 50 mg every day to current treatment plan of Vyvanse 30mg  QD

## 2023-04-24 ENCOUNTER — Other Ambulatory Visit: Payer: Self-pay | Admitting: Family Medicine

## 2023-04-24 ENCOUNTER — Encounter: Payer: Self-pay | Admitting: Nurse Practitioner

## 2023-04-24 NOTE — Telephone Encounter (Signed)
Vyvance on back order.

## 2023-04-30 ENCOUNTER — Ambulatory Visit: Payer: 59 | Admitting: Nurse Practitioner

## 2023-04-30 ENCOUNTER — Other Ambulatory Visit: Payer: Self-pay | Admitting: Nurse Practitioner

## 2023-04-30 DIAGNOSIS — F5081 Binge eating disorder: Secondary | ICD-10-CM

## 2023-04-30 MED ORDER — LISDEXAMFETAMINE DIMESYLATE 30 MG PO CAPS: 30.0000 mg | ORAL_CAPSULE | Freq: Every morning | ORAL | 0 refills | Status: DC

## 2023-04-30 NOTE — Progress Notes (Signed)
I have reviewed the PDMP during this encounter. And the medication was not picked up at this time. Previous rx for vyvanse was sent however the pharmacy indicates they did not receive the Rx.

## 2023-05-29 ENCOUNTER — Other Ambulatory Visit: Payer: Self-pay

## 2023-05-29 DIAGNOSIS — Z30433 Encounter for removal and reinsertion of intrauterine contraceptive device: Secondary | ICD-10-CM | POA: Diagnosis not present

## 2023-06-10 DIAGNOSIS — Z01419 Encounter for gynecological examination (general) (routine) without abnormal findings: Secondary | ICD-10-CM | POA: Diagnosis not present

## 2023-06-10 DIAGNOSIS — Z113 Encounter for screening for infections with a predominantly sexual mode of transmission: Secondary | ICD-10-CM | POA: Diagnosis not present

## 2023-06-10 DIAGNOSIS — Z1231 Encounter for screening mammogram for malignant neoplasm of breast: Secondary | ICD-10-CM | POA: Diagnosis not present

## 2023-06-10 LAB — HM MAMMOGRAPHY

## 2023-06-20 ENCOUNTER — Ambulatory Visit: Payer: 59 | Admitting: Nurse Practitioner

## 2023-06-20 NOTE — Patient Instructions (Signed)
 Preventing Type 2 Diabetes Mellitus  Type 2 diabetes, also called type 2 diabetes mellitus, is a long-term (chronic) disease that affects sugar (glucose) levels in your blood. Normally, a hormone called insulin allows glucose to enter cells in your body. The cells use glucose for energy. With type 2 diabetes, you will have one or both of these problems:  Your pancreas does not make enough insulin.  Cells in your body do not respond properly to insulin that your body makes (insulin resistance).  Insulin resistance or lack of insulin causes extra glucose to build up in the blood instead of going into cells. As a result, high blood glucose (hyperglycemia) develops. That can cause many complications. Being overweight or obese and having an inactive (sedentary) lifestyle can increase your risk for diabetes. Type 2 diabetes can be delayed or prevented by making certain nutrition and lifestyle changes.  How can this condition affect me?  If you do not take steps to prevent diabetes, your blood glucose levels may keep increasing over time. Too much glucose in your blood for a long time can damage your blood vessels, heart, kidneys, nerves, and eyes.  Type 2 diabetes can lead to chronic health problems and complications, such as:  Heart disease.  Stroke.  Blindness.  Kidney disease.  Depression.  Poor circulation in your feet and legs. In severe cases, a foot or leg may need to be surgically removed (amputated).  What can increase my risk?  You may be more likely to develop type 2 diabetes if you:  Have type 2 diabetes in your family.  Are overweight or obese.  Have a sedentary lifestyle.  Have insulin resistance or a history of prediabetes.  Have a history of pregnancy-related (gestational) diabetes or polycystic ovary syndrome (PCOS).  What actions can I take to prevent this?  It can be difficult to recognize signs of type 2 diabetes. Taking action to prevent the disease before you develop symptoms is the best way to avoid  possible damage to your body. Making certain nutrition and lifestyle changes may prevent or delay the disease and related health problems.  Nutrition    Eat healthy meals and snacks regularly. Do not skip meals. Fruit or a handful of nuts is a healthy snack between meals.  Drink water throughout the day. Avoid drinks that contain added sugar, such as soda or sweetened tea. Drink enough fluid to keep your urine pale yellow.  Follow instructions from your health care provider about eating or drinking restrictions.  Limit the amount of food you eat by:  Managing how much you eat at a time (portion size).  Checking food labels for the serving sizes of food.  Using a kitchen scale to weigh amounts of food.  Saut or steam food instead of frying it. Cook with water or broth instead of oils or butter.  Limit saturated fat and salt (sodium) in your diet. Have no more than 1 tsp (2,400 mg) of sodium a day. If you have heart disease or high blood pressure, use less than ? tsp (1,500 mg) of sodium a day.  Lifestyle    Lose weight if needed and as told. Your health care provider can determine how much weight loss is best for you and can help you lose weight safely.  If you are overweight or obese, you may be told to lose at least 5?7% of your body weight.  Manage blood pressure, cholesterol, and stress. Your health care provider will help determine the best treatment  for you.  Do not use any products that contain nicotine or tobacco. These products include cigarettes, chewing tobacco, and vaping devices, such as e-cigarettes. If you need help quitting, ask your health care provider.  Activity    Do physical activity that makes your heart beat faster and makes you sweat (moderate intensity). Do this for at least 30 minutes on at least 5 days of the week, or as much as told by your health care provider.  Ask your health care provider what activities are safe for you. A mix of activities may be best, such as walking, swimming,  cycling, and strength training.  Try to add physical activity into your day. For example:  Park your car farther away than usual so that you walk more.  Take a walk during your lunch break.  Use stairs instead of elevators or escalators.  Walk or bike to work instead of driving.  Alcohol use  If you drink alcohol:  Limit how much you have to:  0?1 drink a day for women who are not pregnant.  0?2 drinks a day for men.  Know how much alcohol is in your drink. In the [place] one drink equals one 12 oz bottle of beer (355 mL), one 5 oz glass of wine (148 mL), or one 1 oz glass of hard liquor (44 mL).  General information  Talk with your health care provider about your risk factors and how you can reduce your risk for diabetes.  Have your blood glucose tested regularly, as told by your health care provider.  Get screening tests as told by your health care provider. You may have these regularly, especially if you have certain risk factors for type 2 diabetes.  Make an appointment with a registered dietitian. This diet and nutrition specialist can help you make a healthy eating plan and help you understand portion sizes and food labels.  Where to find support  Ask your health care provider to recommend a registered dietitian, a certified diabetes care and education specialist, or a weight loss program.  Look for local or online weight loss groups.  Join a gym, fitness club, or outdoor activity group, such as a walking club.  Where to find more information  For help and guidance and to learn more about diabetes and diabetes prevention, visit:  American Diabetes Association (ADA): www.diabetes.[employer] of Diabetes and Digestive and Kidney Diseases: CarFlippers.tn  To learn more about healthy eating, visit:  [place] Department of Agriculture Architect): https://ball-collins.biz/  Office of Disease Prevention and Health Promotion (ODPHP): ResearchName.uy  Summary  You can delay or prevent type 2 diabetes by eating healthy  foods, losing weight if needed, and increasing your physical activity.  Talk with your health care provider about your risk factors for type 2 diabetes and how you can reduce your risk.  It can be difficult to recognize the signs of type 2 diabetes. The best way to avoid possible damage to your body is to take action to prevent the disease before you develop symptoms.  Get screening tests as told by your health care provider.  This information is not intended to replace advice given to you by your health care provider. Make sure you discuss any questions you have with your health care provider.  Document Revised: 01/02/2021 Document Reviewed: 01/02/2021  Elsevier Patient Education  2024 [employer].

## 2023-06-25 ENCOUNTER — Ambulatory Visit: Payer: 59 | Admitting: Nurse Practitioner

## 2023-06-25 ENCOUNTER — Encounter: Payer: Self-pay | Admitting: Nurse Practitioner

## 2023-06-25 ENCOUNTER — Other Ambulatory Visit (HOSPITAL_BASED_OUTPATIENT_CLINIC_OR_DEPARTMENT_OTHER): Payer: Self-pay

## 2023-06-25 VITALS — BP 130/82 | HR 99 | Temp 99.1°F | Ht 68.0 in | Wt 330.0 lb

## 2023-06-25 DIAGNOSIS — Z2821 Immunization not carried out because of patient refusal: Secondary | ICD-10-CM

## 2023-06-25 DIAGNOSIS — R7303 Prediabetes: Secondary | ICD-10-CM

## 2023-06-25 DIAGNOSIS — R5383 Other fatigue: Secondary | ICD-10-CM | POA: Diagnosis not present

## 2023-06-25 DIAGNOSIS — R030 Elevated blood-pressure reading, without diagnosis of hypertension: Secondary | ICD-10-CM

## 2023-06-25 DIAGNOSIS — Z6841 Body Mass Index (BMI) 40.0 and over, adult: Secondary | ICD-10-CM | POA: Diagnosis not present

## 2023-06-25 DIAGNOSIS — F5081 Binge eating disorder: Secondary | ICD-10-CM

## 2023-06-25 DIAGNOSIS — E782 Mixed hyperlipidemia: Secondary | ICD-10-CM

## 2023-06-25 DIAGNOSIS — R0683 Snoring: Secondary | ICD-10-CM

## 2023-06-25 DIAGNOSIS — Z79899 Other long term (current) drug therapy: Secondary | ICD-10-CM | POA: Diagnosis not present

## 2023-06-25 MED ORDER — ROSUVASTATIN CALCIUM 10 MG PO TABS
10.0000 mg | ORAL_TABLET | Freq: Every day | ORAL | 1 refills | Status: DC
  Filled 2023-06-25 – 2023-11-26 (×2): qty 90, 90d supply, fill #0
  Filled 2024-05-15: qty 90, 90d supply, fill #1

## 2023-06-25 MED ORDER — TOPIRAMATE 50 MG PO TABS
50.0000 mg | ORAL_TABLET | Freq: Every day | ORAL | 1 refills | Status: DC
  Filled 2023-06-25 – 2023-07-03 (×2): qty 90, 90d supply, fill #0
  Filled 2023-11-26: qty 90, 90d supply, fill #1

## 2023-06-25 MED ORDER — LISDEXAMFETAMINE DIMESYLATE 30 MG PO CAPS: 30.0000 mg | ORAL_CAPSULE | Freq: Every morning | ORAL | 0 refills | Status: DC

## 2023-06-25 MED ORDER — ESCITALOPRAM OXALATE 10 MG PO TABS
10.0000 mg | ORAL_TABLET | Freq: Every day | ORAL | 1 refills | Status: DC
  Filled 2023-06-25 – 2023-07-03 (×2): qty 90, 90d supply, fill #0
  Filled 2023-10-29: qty 90, 90d supply, fill #1

## 2023-06-25 MED ORDER — LISDEXAMFETAMINE DIMESYLATE 30 MG PO CAPS
30.0000 mg | ORAL_CAPSULE | Freq: Every morning | ORAL | 0 refills | Status: DC
Start: 2023-07-25 — End: 2023-11-12

## 2023-06-25 MED ORDER — LISDEXAMFETAMINE DIMESYLATE 30 MG PO CAPS
30.0000 mg | ORAL_CAPSULE | Freq: Every morning | ORAL | 0 refills | Status: DC
Start: 2023-08-24 — End: 2023-11-12

## 2023-06-25 NOTE — Assessment & Plan Note (Addendum)
HgbA1c was 5.9 at last visit, continue focusing on healthy diet and encouraged her to exercise regularly

## 2023-06-25 NOTE — Progress Notes (Signed)
Madelaine Bhat, CMA,acting as a Neurosurgeon for Arnette Felts, FNP.,have documented all relevant documentation on the behalf of Arnette Felts, FNP,as directed by  Arnette Felts, FNP while in the presence of Arnette Felts, FNP.  Subjective:  Patient ID: Meghan Griffin , female    DOB: 09/01/1981 , 42 y.o.   MRN: [id number]  Chief Complaint  Patient presents with   Prediabetes    HPI  Patient presents today for a cholesterol and pre dm follow up, patient reports compliance with medications. Patient denies any chest pain, SOB, or headaches. Patient has no concerns today. She is taking the vyvanse for binge eating and she noticed she does not have time to snack. She is not exercising regularly, but does try to get up and walk around at work.      Past Medical History:  Diagnosis Date   Eczema    Hemorrhoids    History of gonorrhea 2006   treated   Obesity      Family History  Problem Relation Age of Onset   Alcohol abuse Father    COPD Father    Hyperlipidemia Father    CAD Father 38       MI   Stroke Father 31   Hypertension Father    Diabetes Father    Hyperlipidemia Maternal Grandmother    Hypertension Maternal Grandmother    Hyperlipidemia Maternal Grandfather    Hypertension Maternal Grandfather    Cancer Other        great grandmother - bone marrow   Cancer Paternal Aunt        leukemia     Current Outpatient Medications:    B Complex-C (SUPER B COMPLEX PO), Take by mouth., Disp: , Rfl:    Emollient (PALMERS COCOA BUTTER FORMULA) CREA, Apply topically as directed., Disp: 434 g, Rfl: 0   EPINEPHrine (EPIPEN 2-PAK) 0.3 mg/0.3 mL IJ SOAJ injection, Inject 0.3 mg into the muscle as needed for anaphylaxis., Disp: 2 each, Rfl: 2   Magnesium 250 MG TABS, Take by mouth., Disp: , Rfl:    menthol-zinc oxide (GOLD BOND) powder, Apply topically 2 (two) times daily., Disp: 113 g, Rfl: 0   nystatin (MYCOSTATIN/NYSTOP) powder, Apply 1 Application topically 3 (three) times daily.,  Disp: 15 g, Rfl: 0   Vitamin D, Ergocalciferol, (DRISDOL) 1.25 MG (50000 UNIT) CAPS capsule, Take 50,000 Units by mouth every 7 (seven) days., Disp: , Rfl:    escitalopram (LEXAPRO) 10 MG tablet, Take 1 tablet (10 mg total) by mouth daily., Disp: 90 tablet, Rfl: 1   lisdexamfetamine (VYVANSE) 30 MG capsule, Take 1 capsule (30 mg total) by mouth in the morning., Disp: 30 capsule, Rfl: 0   [START ON 07/25/2023] lisdexamfetamine (VYVANSE) 30 MG capsule, Take 1 capsule (30 mg total) by mouth in the morning., Disp: 30 capsule, Rfl: 0   [START ON 08/24/2023] lisdexamfetamine (VYVANSE) 30 MG capsule, Take 1 capsule (30 mg total) by mouth in the morning., Disp: 30 capsule, Rfl: 0   rosuvastatin (CRESTOR) 10 MG tablet, Take 1 tablet (10 mg total) by mouth at bedtime., Disp: 90 tablet, Rfl: 1   topiramate (TOPAMAX) 50 MG tablet, Take 1 tablet (50 mg total) by mouth daily., Disp: 90 tablet, Rfl: 1   Allergies  Allergen Reactions   Amoxicillin Diarrhea    Has patient had a PCN reaction causing immediate rash, facial/tongue/throat swelling, SOB or lightheadedness with hypotension: no Has patient had a PCN reaction causing severe rash involving mucus membranes or skin  necrosis: no Has patient had a PCN reaction that required hospitalization: no Has patient had a PCN reaction occurring within the last 10 years: yes If all of the above answers are "NO", then may proceed with Cephalosporin use.    Justicia Adhatoda Anaphylaxis   Other Anaphylaxis and Swelling    Tree nuts   Shellfish-Derived Products Anaphylaxis    GI upset-lobster and crab     Review of Systems  Constitutional: Negative.   HENT: Negative.    Eyes: Negative.   Respiratory: Negative.    Cardiovascular: Negative.   Gastrointestinal: Negative.   Psychiatric/Behavioral: Negative.       Today's Vitals   06/25/23 1631  BP: 130/82  Pulse: 99  Temp: 99.1 F (37.3 C)  Weight: (!) 330 lb (149.7 kg)  Height: 5\' 8"  (1.727 m)  PainSc:  0-No pain   Body mass index is 50.18 kg/m.  Wt Readings from Last 3 Encounters:  06/25/23 (!) 330 lb (149.7 kg)  04/17/23 (!) 324 lb 6.4 oz (147.1 kg)  01/01/23 (!) 321 lb (145.6 kg)      Objective:  Physical Exam Vitals reviewed.  Constitutional:      General: She is not in acute distress.    Appearance: Normal appearance. She is well-developed. She is obese.  Cardiovascular:     Rate and Rhythm: Normal rate and regular rhythm.     Pulses: Normal pulses.     Heart sounds: Normal heart sounds. No murmur heard. Pulmonary:     Effort: Pulmonary effort is normal. No respiratory distress.     Breath sounds: Normal breath sounds. No wheezing.  Chest:     Chest wall: No tenderness.  Musculoskeletal:        General: Normal range of motion.  Skin:    General: Skin is warm and dry.     Capillary Refill: Capillary refill takes less than 2 seconds.  Neurological:     General: No focal deficit present.     Mental Status: She is alert and oriented to person, place, and time.     Cranial Nerves: No cranial nerve deficit.  Psychiatric:        Mood and Affect: Mood normal.        Behavior: Behavior normal.        Thought Content: Thought content normal.        Judgment: Judgment normal.         Assessment And Plan:  Prediabetes Assessment & Plan: HgbA1c was 5.9 at last visit, continue focusing on healthy diet and encouraged her to exercise regularly  Orders: -     Basic metabolic panel -     Hemoglobin A1c  Mixed hyperlipidemia Assessment & Plan: Cholesterol levels are stable, continue focusing on low fat diet.   Orders: -     Lipid panel  Other fatigue Assessment & Plan: Will refer for sleep study due to history of snoring  Orders: -     VITAMIN D 25 Hydroxy (Vit-D Deficiency, Fractures) -     Ambulatory referral to Sleep Studies  Binge eating disorder Assessment & Plan: Continue Vyvanse  Orders: -     Lisdexamfetamine Dimesylate; Take 1 capsule (30 mg total)  by mouth in the morning.  Dispense: 30 capsule; Refill: 0 -     Topiramate; Take 1 tablet (50 mg total) by mouth daily.  Dispense: 90 tablet; Refill: 1 -     Lisdexamfetamine Dimesylate; Take 1 capsule (30 mg total) by mouth in the morning.  Dispense: 30 capsule; Refill: 0 -     Lisdexamfetamine Dimesylate; Take 1 capsule (30 mg total) by mouth in the morning.  Dispense: 30 capsule; Refill: 0  Snoring Assessment & Plan: Will refer for a sleep study. This may help her with her weight.  Discussed risk of cardiac event with uncontrolled sleep apnea.   Orders: -     Ambulatory referral to Sleep Studies  Elevated blood pressure reading Assessment & Plan: Blood pressure is slightly elevated advised to to limit intake of salt and to increase physical activity   Influenza vaccination declined Assessment & Plan: Declines shingrix, educated on disease process and is aware if he changes his mind to notify office    Class 3 severe obesity due to excess calories with body mass index (BMI) of 50.0 to 59.9 in adult, unspecified whether serious comorbidity present [facility]) Assessment & Plan: She is encouraged to strive for BMI less than 30 to decrease cardiac risk. Advised to aim for at least 150 minutes of exercise per week.  Encouraged to park further when at the store, take stairs instead of elevators and to walk in place during commercials. Increase water intake to at least one gallon of water daily.    Orders: -     Ambulatory referral to Sleep Studies  Other long term (current) drug therapy -     CBC  Other orders -     Escitalopram Oxalate; Take 1 tablet (10 mg total) by mouth daily.  Dispense: 90 tablet; Refill: 1 -     Rosuvastatin Calcium; Take 1 tablet (10 mg total) by mouth at bedtime.  Dispense: 90 tablet; Refill: 1    No follow-ups on file.  Patient was given opportunity to ask questions. Patient verbalized understanding of the plan and was able to repeat key elements of the  plan. All questions were answered to their satisfaction.    Jeanell Sparrow, FNP, have reviewed all documentation for this visit. The documentation on 06/25/23 for the exam, diagnosis, procedures, and orders are all accurate and complete.  IF YOU HAVE BEEN REFERRED TO A SPECIALIST, IT MAY TAKE 1-2 WEEKS TO SCHEDULE/PROCESS THE REFERRAL. IF YOU HAVE NOT HEARD FROM US/SPECIALIST IN TWO WEEKS, PLEASE GIVE [place] A CALL AT [phone].

## 2023-06-26 ENCOUNTER — Other Ambulatory Visit: Payer: Self-pay

## 2023-06-26 LAB — LIPID PANEL
Chol/HDL Ratio: 4.8 ratio — ABNORMAL HIGH (ref 0.0–4.4)
Cholesterol, Total: 206 mg/dL — ABNORMAL HIGH (ref 100–199)
HDL: 43 mg/dL (ref >39)
LDL Chol Calc (NIH): 142 mg/dL — ABNORMAL HIGH (ref 0–99)
Triglycerides: 115 mg/dL (ref 0–149)
VLDL Cholesterol Cal: 21 mg/dL (ref 5–40)

## 2023-06-26 LAB — BASIC METABOLIC PANEL
BUN/Creatinine Ratio: 14 (ref 9–23)
BUN: 10 mg/dL (ref 6–24)
CO2: 24 mmol/L (ref 20–29)
Calcium: 9.3 mg/dL (ref 8.7–10.2)
Chloride: 106 mmol/L (ref 96–106)
Creatinine, Ser: 0.74 mg/dL (ref 0.57–1.00)
Glucose: 100 mg/dL — ABNORMAL HIGH (ref 70–99)
Potassium: 3.9 mmol/L (ref 3.5–5.2)
Sodium: 141 mmol/L (ref 134–144)
eGFR: 103 mL/min/{1.73_m2} (ref >59)

## 2023-06-26 LAB — CBC
Hematocrit: 38.3 % (ref 34.0–46.6)
Hemoglobin: 12.5 g/dL (ref 11.1–15.9)
MCH: 29.8 pg (ref 26.6–33.0)
MCHC: 32.6 g/dL (ref 31.5–35.7)
MCV: 91 fL (ref 79–97)
Platelets: 254 10*3/uL (ref 150–450)
RBC: 4.2 x10E6/uL (ref 3.77–5.28)
RDW: 12.9 % (ref 11.7–15.4)
WBC: 8.7 10*3/uL (ref 3.4–10.8)

## 2023-06-26 LAB — HEMOGLOBIN A1C
Est. average glucose Bld gHb Est-mCnc: 123 mg/dL
Hgb A1c MFr Bld: 5.9 % — ABNORMAL HIGH (ref 4.8–5.6)

## 2023-06-26 LAB — VITAMIN D 25 HYDROXY (VIT D DEFICIENCY, FRACTURES): Vit D, 25-Hydroxy: 83.9 ng/mL (ref 30.0–100.0)

## 2023-07-03 ENCOUNTER — Other Ambulatory Visit (HOSPITAL_COMMUNITY): Payer: Self-pay

## 2023-07-03 ENCOUNTER — Other Ambulatory Visit (HOSPITAL_BASED_OUTPATIENT_CLINIC_OR_DEPARTMENT_OTHER): Payer: Self-pay

## 2023-07-10 DIAGNOSIS — R030 Elevated blood-pressure reading, without diagnosis of hypertension: Secondary | ICD-10-CM | POA: Insufficient documentation

## 2023-07-10 DIAGNOSIS — R5383 Other fatigue: Secondary | ICD-10-CM | POA: Insufficient documentation

## 2023-07-10 DIAGNOSIS — E782 Mixed hyperlipidemia: Secondary | ICD-10-CM | POA: Insufficient documentation

## 2023-07-10 DIAGNOSIS — R0683 Snoring: Secondary | ICD-10-CM | POA: Insufficient documentation

## 2023-07-10 DIAGNOSIS — Z2821 Immunization not carried out because of patient refusal: Secondary | ICD-10-CM | POA: Insufficient documentation

## 2023-07-10 NOTE — Assessment & Plan Note (Signed)
Declines shingrix, educated on disease process and is aware if he changes his mind to notify office

## 2023-07-10 NOTE — Assessment & Plan Note (Signed)
Cholesterol levels are stable, continue focusing on low fat diet

## 2023-07-10 NOTE — Assessment & Plan Note (Addendum)
Will refer for sleep study due to history of snoring

## 2023-07-10 NOTE — Assessment & Plan Note (Signed)
Will refer for a sleep study. This may help her with her weight.  Discussed risk of cardiac event with uncontrolled sleep apnea.

## 2023-07-10 NOTE — Assessment & Plan Note (Signed)
She is encouraged to strive for BMI less than 30 to decrease cardiac risk. Advised to aim for at least 150 minutes of exercise per week.  Encouraged to park further when at the store, take stairs instead of elevators and to walk in place during commercials. Increase water intake to at least one gallon of water daily.

## 2023-07-10 NOTE — Assessment & Plan Note (Signed)
Continue Vyvanse

## 2023-07-10 NOTE — Assessment & Plan Note (Signed)
Blood pressure is slightly elevated advised to to limit intake of salt and to increase physical activity

## 2023-07-18 ENCOUNTER — Other Ambulatory Visit (HOSPITAL_BASED_OUTPATIENT_CLINIC_OR_DEPARTMENT_OTHER): Payer: Self-pay

## 2023-07-18 MED ORDER — INFLUENZA VIRUS VACC SPLIT PF (FLUZONE) 0.5 ML IM SUSY
0.5000 mL | PREFILLED_SYRINGE | Freq: Once | INTRAMUSCULAR | 0 refills | Status: AC
  Filled 2023-07-19: qty 0.5, 1d supply, fill #0

## 2023-07-19 ENCOUNTER — Other Ambulatory Visit (HOSPITAL_BASED_OUTPATIENT_CLINIC_OR_DEPARTMENT_OTHER): Payer: Self-pay

## 2023-08-30 ENCOUNTER — Other Ambulatory Visit: Payer: Self-pay

## 2023-09-07 DIAGNOSIS — H5213 Myopia, bilateral: Secondary | ICD-10-CM | POA: Diagnosis not present

## 2023-09-07 DIAGNOSIS — H52223 Regular astigmatism, bilateral: Secondary | ICD-10-CM | POA: Diagnosis not present

## 2023-10-21 ENCOUNTER — Encounter: Payer: Self-pay | Admitting: Nurse Practitioner

## 2023-10-29 ENCOUNTER — Encounter: Payer: Self-pay | Admitting: Nurse Practitioner

## 2023-10-30 ENCOUNTER — Other Ambulatory Visit (HOSPITAL_COMMUNITY): Payer: Self-pay

## 2023-10-30 NOTE — Telephone Encounter (Signed)
 She needs an appt she has not been seen in 4 months

## 2023-11-12 ENCOUNTER — Encounter: Payer: Self-pay | Admitting: Nurse Practitioner

## 2023-11-12 ENCOUNTER — Other Ambulatory Visit (HOSPITAL_COMMUNITY): Payer: Self-pay

## 2023-11-12 ENCOUNTER — Ambulatory Visit: Payer: Commercial Managed Care - PPO | Admitting: Nurse Practitioner

## 2023-11-12 VITALS — BP 130/78 | HR 88 | Temp 98.9°F | Ht 68.0 in | Wt 330.5 lb

## 2023-11-12 DIAGNOSIS — E66813 Obesity, class 3: Secondary | ICD-10-CM | POA: Diagnosis not present

## 2023-11-12 DIAGNOSIS — Z6841 Body Mass Index (BMI) 40.0 and over, adult: Secondary | ICD-10-CM

## 2023-11-12 DIAGNOSIS — L308 Other specified dermatitis: Secondary | ICD-10-CM

## 2023-11-12 DIAGNOSIS — E669 Obesity, unspecified: Secondary | ICD-10-CM | POA: Insufficient documentation

## 2023-11-12 DIAGNOSIS — E782 Mixed hyperlipidemia: Secondary | ICD-10-CM

## 2023-11-12 DIAGNOSIS — Z2821 Immunization not carried out because of patient refusal: Secondary | ICD-10-CM

## 2023-11-12 DIAGNOSIS — F50819 Binge eating disorder, unspecified: Secondary | ICD-10-CM | POA: Diagnosis not present

## 2023-11-12 MED ORDER — MOMETASONE FUROATE 0.1 % EX CREA
1.0000 | TOPICAL_CREAM | Freq: Every day | CUTANEOUS | 0 refills | Status: AC
  Filled 2023-11-12: qty 45, 30d supply, fill #0

## 2023-11-12 MED ORDER — LISDEXAMFETAMINE DIMESYLATE 40 MG PO CAPS
40.0000 mg | ORAL_CAPSULE | ORAL | 0 refills | Status: DC
Start: 2023-11-12 — End: 2024-01-07
  Filled 2023-11-12: qty 30, 30d supply, fill #0

## 2023-11-12 MED ORDER — LISDEXAMFETAMINE DIMESYLATE 40 MG PO CAPS
40.0000 mg | ORAL_CAPSULE | ORAL | 0 refills | Status: DC
  Filled 2023-12-20: qty 30, 30d supply, fill #0

## 2023-11-12 MED ORDER — LISDEXAMFETAMINE DIMESYLATE 40 MG PO CAPS: 40.0000 mg | ORAL_CAPSULE | ORAL | 0 refills | Status: DC

## 2023-11-12 NOTE — Assessment & Plan Note (Signed)
 She is encouraged to strive for BMI less than 30 to decrease cardiac risk. Advised to aim for at least 150 minutes of exercise per week.

## 2023-11-12 NOTE — Patient Instructions (Signed)
Goal to exercise 150 minutes per week with at least 2 days of strength training Encouraged to park further when at the store, take stairs instead of elevators and to walk in place during commercials. Increase water intake to at least one gallon of water daily.

## 2023-11-12 NOTE — Progress Notes (Signed)
Madelaine Bhat, CMA,acting as a Neurosurgeon for Arnette Felts, FNP.,have documented all relevant documentation on the behalf of Arnette Felts, FNP,as directed by  Arnette Felts, FNP while in the presence of Arnette Felts, FNP.  Subjective:  Patient ID: Meghan Griffin , female    DOB: 05-26-1981 , 43 y.o.   MRN: [id number]  Chief Complaint  Patient presents with   Eating Disorder    HPI  Patient presents today for a vyvanse follow up.  She has not been for her sleep study due to timing. Patient reports compliance with medication. Patient denies any chest pain, SOB, or headaches. Patient has a rash on both her hands.   Exercising - the past 2 weeks has been walking for about 10 minutes in am and 10 minutes or more in the afternoon. She feels like in the afternoon she wants to snack. She will try to eat fruit. She does try to cut out junk food. She has noticed she will eat out. She has stress eating. 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 She has not been to a weight loss clinic. She does report being heavy all her life.      Past Medical History:  Diagnosis Date   Eczema    Hemorrhoids    History of gonorrhea 2006   treated   Obesity      Family History  Problem Relation Age of Onset   Alcohol abuse Father    COPD Father    Hyperlipidemia Father    CAD Father 37       MI   Stroke Father 102   Hypertension Father    Diabetes Father    Hyperlipidemia Maternal Grandmother    Hypertension Maternal Grandmother    Hyperlipidemia Maternal Grandfather    Hypertension Maternal Grandfather    Cancer Other        great grandmother - bone marrow   Cancer Paternal Aunt        leukemia     Current Outpatient Medications:    B Complex-C (SUPER B COMPLEX PO), Take by mouth., Disp: , Rfl:    Emollient (PALMERS COCOA BUTTER FORMULA) CREA, Apply topically as directed., Disp: 434 g, Rfl: 0   EPINEPHrine (EPIPEN 2-PAK) 0.3 mg/0.3 mL IJ SOAJ injection, Inject 0.3 mg into the muscle as needed for  anaphylaxis., Disp: 2 each, Rfl: 2   escitalopram (LEXAPRO) 10 MG tablet, Take 1 tablet (10 mg total) by mouth daily., Disp: 90 tablet, Rfl: 1   lisdexamfetamine (VYVANSE) 40 MG capsule, Take 1 capsule (40 mg total) by mouth every morning., Disp: 30 capsule, Rfl: 0   [START ON 12/12/2023] lisdexamfetamine (VYVANSE) 40 MG capsule, Take 1 capsule (40 mg total) by mouth every morning., Disp: 30 capsule, Rfl: 0   [START ON 01/11/2024] lisdexamfetamine (VYVANSE) 40 MG capsule, Take 1 capsule (40 mg total) by mouth every morning., Disp: 30 capsule, Rfl: 0   Magnesium 250 MG TABS, Take by mouth., Disp: , Rfl:    menthol-zinc oxide (GOLD BOND) powder, Apply topically 2 (two) times daily., Disp: 113 g, Rfl: 0   mometasone (ELOCON) 0.1 % cream, Apply 1 Application topically daily., Disp: 45 g, Rfl: 0   nystatin (MYCOSTATIN/NYSTOP) powder, Apply 1 Application topically 3 (three) times daily., Disp: 15 g, Rfl: 0   rosuvastatin (CRESTOR) 10 MG tablet, Take 1 tablet (10 mg total) by mouth at bedtime., Disp: 90 tablet, Rfl: 1   topiramate (TOPAMAX) 50 MG tablet, Take 1 tablet (50 mg total) by  mouth daily., Disp: 90 tablet, Rfl: 1   Vitamin D, Ergocalciferol, (DRISDOL) 1.25 MG (50000 UNIT) CAPS capsule, Take 50,000 Units by mouth every 7 (seven) days., Disp: , Rfl:    Allergies  Allergen Reactions   Amoxicillin Diarrhea    Has patient had a PCN reaction causing immediate rash, facial/tongue/throat swelling, SOB or lightheadedness with hypotension: no Has patient had a PCN reaction causing severe rash involving mucus membranes or skin necrosis: no Has patient had a PCN reaction that required hospitalization: no Has patient had a PCN reaction occurring within the last 10 years: yes If all of the above answers are "NO", then may proceed with Cephalosporin use.    Justicia Adhatoda Anaphylaxis   Other Anaphylaxis and Swelling    Tree nuts   Shellfish-Derived Products Anaphylaxis    GI upset-lobster and crab      Review of Systems  Constitutional: Negative.   Respiratory: Negative.    Cardiovascular: Negative.   Musculoskeletal: Negative.   Skin:  Positive for rash (posterior hands bilaterally).  Neurological: Negative.   Psychiatric/Behavioral: Negative.       Today's Vitals   11/12/23 0832  BP: 130/78  Pulse: 88  Temp: 98.9 F (37.2 C)  TempSrc: Oral  Weight: (!) 330 lb 8 oz (149.9 kg)  Height: 5\' 8"  (1.727 m)  PainSc: 0-No pain   Body mass index is 50.25 kg/m.  Wt Readings from Last 3 Encounters:  11/12/23 (!) 330 lb 8 oz (149.9 kg)  06/25/23 (!) 330 lb (149.7 kg)  04/17/23 (!) 324 lb 6.4 oz (147.1 kg)      Objective:  Physical Exam Vitals reviewed.  Constitutional:      General: She is not in acute distress.    Appearance: Normal appearance. She is well-developed. She is obese.  Cardiovascular:     Rate and Rhythm: Normal rate and regular rhythm.     Pulses: Normal pulses.     Heart sounds: Normal heart sounds. No murmur heard. Pulmonary:     Effort: Pulmonary effort is normal. No respiratory distress.     Breath sounds: Normal breath sounds. No wheezing.  Chest:     Chest wall: No tenderness.  Musculoskeletal:        General: Normal range of motion.  Skin:    General: Skin is warm and dry.     Capillary Refill: Capillary refill takes less than 2 seconds.     Findings: Rash (hyperpigmentation and dry rash to bilateral posterior hands and mid back) present.  Neurological:     General: No focal deficit present.     Mental Status: She is alert and oriented to person, place, and time.     Cranial Nerves: No cranial nerve deficit.  Psychiatric:        Mood and Affect: Mood normal.        Behavior: Behavior normal.        Thought Content: Thought content normal.        Judgment: Judgment normal.        11/12/2023    8:31 AM 12/26/2022    3:32 PM 10/29/2014    3:09 PM 09/24/2014    3:24 PM  Depression screen PHQ 2/9  Decreased Interest 0 0 0 0  Down, Depressed,  Hopeless 0 1 0 0  PHQ - 2 Score 0 1 0 0  Altered sleeping 0 1    Tired, decreased energy 0 1    Change in appetite 0 1    Feeling  bad or failure about yourself  0 0    Trouble concentrating 0 2    Moving slowly or fidgety/restless 0 0    Suicidal thoughts 0 0    PHQ-9 Score 0 6    Difficult doing work/chores Not difficult at all Not difficult at all        11/12/2023    8:31 AM 12/26/2022    3:33 PM  GAD 7 : Generalized Anxiety Score  Nervous, Anxious, on Edge 0 0  Control/stop worrying 0 0  Worry too much - different things 0 0  Trouble relaxing 0 0  Restless 0 1  Easily annoyed or irritable 0 1  Afraid - awful might happen 0 0  Total GAD 7 Score 0 2  Anxiety Difficulty Not difficult at all Not difficult at all      Assessment And Plan:  Binge eating disorder, unspecified severity Assessment & Plan: I will increase her vyvanse to 40 mg due to feeling hungry in the evening.   Orders: -     Amb Ref to Medical Weight Management -     Lisdexamfetamine Dimesylate; Take 1 capsule (40 mg total) by mouth every morning.  Dispense: 30 capsule; Refill: 0 -     Lisdexamfetamine Dimesylate; Take 1 capsule (40 mg total) by mouth every morning.  Dispense: 30 capsule; Refill: 0 -     Lisdexamfetamine Dimesylate; Take 1 capsule (40 mg total) by mouth every morning.  Dispense: 30 capsule; Refill: 0  Other eczema Assessment & Plan: She has a dry rash to her bilateral posterior hands, will send prescription for mometasone.   Orders: -     Mometasone Furoate; Apply 1 Application topically daily.  Dispense: 45 g; Refill: 0  Mixed hyperlipidemia Assessment & Plan: Cholesterol levels are stable, continue focusing on low fat diet.   Orders: -     Lipid panel  COVID-19 vaccination declined Assessment & Plan: Declines covid 19 vaccine. Discussed risk of covid 41 and if she changes her mind about the vaccine to call the office. Education has been provided regarding the importance of this  vaccine but patient still declined. Advised may receive this vaccine at local pharmacy or Health Dept.or vaccine clinic. Aware to provide a copy of the vaccination record if obtained from local pharmacy or Health Dept.  Encouraged to take multivitamin, vitamin d, vitamin c and zinc to increase immune system. Aware can call office if would like to have vaccine here at office. Verbalized acceptance and understanding.    Class 3 severe obesity due to excess calories with body mass index (BMI) of 50.0 to 59.9 in adult, unspecified whether serious comorbidity present [facility]) Assessment & Plan: She is encouraged to strive for BMI less than 30 to decrease cardiac risk. Advised to aim for at least 150 minutes of exercise per week.     Orders: -     Amb Ref to Medical Weight Management -     Insulin, random    Return in about 2 months (around 01/10/2024) for vyvanse f/u, 8-10 week weight check.  Patient was given opportunity to ask questions. Patient verbalized understanding of the plan and was able to repeat key elements of the plan. All questions were answered to their satisfaction.    Jeanell Sparrow, FNP, have reviewed all documentation for this visit. The documentation on 11/12/23 for the exam, diagnosis, procedures, and orders are all accurate and complete.   IF YOU HAVE BEEN REFERRED TO A SPECIALIST, IT MAY  TAKE 1-2 WEEKS TO SCHEDULE/PROCESS THE REFERRAL. IF YOU HAVE NOT HEARD FROM US/SPECIALIST IN TWO WEEKS, PLEASE GIVE [place] A CALL AT [phone].

## 2023-11-12 NOTE — Assessment & Plan Note (Signed)
I will increase her vyvanse to 40 mg due to feeling hungry in the evening.

## 2023-11-13 LAB — LIPID PANEL
Chol/HDL Ratio: 4 {ratio} (ref 0.0–4.4)
Cholesterol, Total: 190 mg/dL (ref 100–199)
HDL: 47 mg/dL (ref >39)
LDL Chol Calc (NIH): 126 mg/dL — ABNORMAL HIGH (ref 0–99)
Triglycerides: 95 mg/dL (ref 0–149)
VLDL Cholesterol Cal: 17 mg/dL (ref 5–40)

## 2023-11-13 LAB — INSULIN, RANDOM: INSULIN: 17.9 u[IU]/mL (ref 2.6–24.9)

## 2023-11-20 ENCOUNTER — Encounter: Payer: Self-pay | Admitting: Nurse Practitioner

## 2023-11-20 NOTE — Assessment & Plan Note (Signed)
She has a dry rash to her bilateral posterior hands, will send prescription for mometasone.

## 2023-11-20 NOTE — Assessment & Plan Note (Signed)
Cholesterol levels are stable, continue focusing on low fat diet

## 2023-11-20 NOTE — Assessment & Plan Note (Signed)

## 2023-11-26 ENCOUNTER — Other Ambulatory Visit (HOSPITAL_COMMUNITY): Payer: Self-pay

## 2023-12-18 ENCOUNTER — Other Ambulatory Visit: Payer: Self-pay | Admitting: Nurse Practitioner

## 2023-12-18 ENCOUNTER — Other Ambulatory Visit: Payer: Self-pay

## 2023-12-18 DIAGNOSIS — F50819 Binge eating disorder, unspecified: Secondary | ICD-10-CM

## 2023-12-20 ENCOUNTER — Other Ambulatory Visit (HOSPITAL_COMMUNITY): Payer: Self-pay

## 2023-12-25 ENCOUNTER — Encounter (INDEPENDENT_AMBULATORY_CARE_PROVIDER_SITE_OTHER): Payer: Self-pay

## 2023-12-25 DIAGNOSIS — N921 Excessive and frequent menstruation with irregular cycle: Secondary | ICD-10-CM | POA: Diagnosis not present

## 2024-01-07 ENCOUNTER — Other Ambulatory Visit (HOSPITAL_COMMUNITY): Payer: Self-pay

## 2024-01-07 ENCOUNTER — Ambulatory Visit: Payer: Commercial Managed Care - PPO | Admitting: Nurse Practitioner

## 2024-01-07 ENCOUNTER — Encounter: Payer: Self-pay | Admitting: Nurse Practitioner

## 2024-01-07 ENCOUNTER — Encounter: Payer: Self-pay | Admitting: Obstetrics and Gynecology

## 2024-01-07 VITALS — BP 120/70 | HR 89 | Temp 98.7°F | Ht 68.0 in | Wt 331.6 lb

## 2024-01-07 DIAGNOSIS — E66813 Obesity, class 3: Secondary | ICD-10-CM

## 2024-01-07 DIAGNOSIS — F50819 Binge eating disorder, unspecified: Secondary | ICD-10-CM | POA: Diagnosis not present

## 2024-01-07 DIAGNOSIS — Z2821 Immunization not carried out because of patient refusal: Secondary | ICD-10-CM

## 2024-01-07 DIAGNOSIS — R0683 Snoring: Secondary | ICD-10-CM | POA: Diagnosis not present

## 2024-01-07 DIAGNOSIS — R41840 Attention and concentration deficit: Secondary | ICD-10-CM

## 2024-01-07 DIAGNOSIS — R5383 Other fatigue: Secondary | ICD-10-CM

## 2024-01-07 DIAGNOSIS — Z6841 Body Mass Index (BMI) 40.0 and over, adult: Secondary | ICD-10-CM

## 2024-01-07 MED ORDER — LISDEXAMFETAMINE DIMESYLATE 50 MG PO CAPS
50.0000 mg | ORAL_CAPSULE | ORAL | 0 refills | Status: DC
  Filled 2024-01-07 – 2024-01-21 (×2): qty 30, 30d supply, fill #0

## 2024-01-07 NOTE — Progress Notes (Signed)
 Meghan Griffin, CMA,acting as a Neurosurgeon for Arnette Felts, FNP.,have documented all relevant documentation on the behalf of Arnette Felts, FNP,as directed by  Arnette Felts, FNP while in the presence of Arnette Felts, FNP.  Subjective:  Patient ID: Meghan Griffin , female    DOB: May 25, 1981 , 43 y.o.   MRN: [id number]  No chief complaint on file.   HPI  Patient presents today for a vyvanse follow up, Patient reports compliance with medication. Patient denies any chest pain, SOB, or headaches. Patient has no concerns today. She feels like she is not eating as much. She will walk the dog for about 10 minutes. She will use a resistance band for about 15 minutes total. She is planning to get out in the evening since will walk more.   She eats a nutrigrain bar and boiled egg [employer] - chicken and dumpling and peas She is not snacking during the day. She is trying to drink more water and occasional soda. She has cut out the energy drinks.   She has not had a sleep study      Past Medical History:  Diagnosis Date   Eczema    Hemorrhoids    History of gonorrhea 2006   treated   Obesity      Family History  Problem Relation Age of Onset   Alcohol abuse Father    COPD Father    Hyperlipidemia Father    CAD Father 9       MI   Stroke Father 83   Hypertension Father    Diabetes Father    Hyperlipidemia Maternal Grandmother    Hypertension Maternal Grandmother    Hyperlipidemia Maternal Grandfather    Hypertension Maternal Grandfather    Cancer Other        great grandmother - bone marrow   Cancer Paternal Aunt        leukemia     Current Outpatient Medications:    B Complex-C (SUPER B COMPLEX PO), Take by mouth., Disp: , Rfl:    Emollient (PALMERS COCOA BUTTER FORMULA) CREA, Apply topically as directed., Disp: 434 g, Rfl: 0   EPINEPHrine (EPIPEN 2-PAK) 0.3 mg/0.3 mL IJ SOAJ injection, Inject 0.3 mg into the muscle as needed for anaphylaxis., Disp: 2 each, Rfl:  2   escitalopram (LEXAPRO) 10 MG tablet, Take 1 tablet (10 mg total) by mouth daily., Disp: 90 tablet, Rfl: 1   Magnesium 250 MG TABS, Take by mouth., Disp: , Rfl:    menthol-zinc oxide (GOLD BOND) powder, Apply topically 2 (two) times daily., Disp: 113 g, Rfl: 0   mometasone (ELOCON) 0.1 % cream, Apply 1 Application topically daily., Disp: 45 g, Rfl: 0   nystatin (MYCOSTATIN/NYSTOP) powder, Apply 1 Application topically 3 (three) times daily., Disp: 15 g, Rfl: 0   rosuvastatin (CRESTOR) 10 MG tablet, Take 1 tablet (10 mg total) by mouth at bedtime., Disp: 90 tablet, Rfl: 1   topiramate (TOPAMAX) 50 MG tablet, Take 1 tablet (50 mg total) by mouth daily., Disp: 90 tablet, Rfl: 1   Vitamin D, Ergocalciferol, (DRISDOL) 1.25 MG (50000 UNIT) CAPS capsule, Take 50,000 Units by mouth every 7 (seven) days., Disp: , Rfl:    lisdexamfetamine (VYVANSE) 50 MG capsule, Take 1 capsule (50 mg total) by mouth every morning., Disp: 30 capsule, Rfl: 0   Allergies  Allergen Reactions   Amoxicillin Diarrhea    Has patient had a PCN reaction causing immediate rash, facial/tongue/throat swelling, SOB or lightheadedness with  hypotension: no Has patient had a PCN reaction causing severe rash involving mucus membranes or skin necrosis: no Has patient had a PCN reaction that required hospitalization: no Has patient had a PCN reaction occurring within the last 10 years: yes If all of the above answers are "NO", then may proceed with Cephalosporin use.    Justicia Adhatoda Anaphylaxis   Other Anaphylaxis and Swelling    Tree nuts   Shellfish-Derived Products Anaphylaxis    GI upset-lobster and crab     Review of Systems  Constitutional: Negative.   Respiratory: Negative.    Cardiovascular: Negative.   Musculoskeletal: Negative.   Skin:  Positive for rash (posterior hands bilaterally).  Neurological: Negative.   Psychiatric/Behavioral: Negative.       Today's Vitals   01/07/24 0928  BP: 120/70  Pulse: 89   Temp: 98.7 F (37.1 C)  TempSrc: Oral  Weight: (!) 331 lb 9.6 oz (150.4 kg)  Height: 5\' 8"  (1.727 m)  PainSc: 0-No pain   Body mass index is 50.42 kg/m.  Wt Readings from Last 3 Encounters:  01/07/24 (!) 331 lb 9.6 oz (150.4 kg)  11/12/23 (!) 330 lb 8 oz (149.9 kg)  06/25/23 (!) 330 lb (149.7 kg)    The 10-year ASCVD risk score (Arnett DK, et al., 2019) is: 0.8%   Values used to calculate the score:     Age: 46 years     Sex: Female     Is Non-Hispanic African American: Yes     Diabetic: No     Tobacco smoker: No     Systolic Blood Pressure: 120 mmHg     Is BP treated: No     HDL Cholesterol: 47 mg/dL     Total Cholesterol: 190 mg/dL  Objective:  Physical Exam Vitals and nursing note reviewed.  Constitutional:      General: She is not in acute distress.    Appearance: Normal appearance. She is well-developed. She is obese.  Cardiovascular:     Rate and Rhythm: Normal rate and regular rhythm.     Pulses: Normal pulses.     Heart sounds: Normal heart sounds. No murmur heard. Pulmonary:     Effort: Pulmonary effort is normal. No respiratory distress.     Breath sounds: Normal breath sounds. No wheezing.  Chest:     Chest wall: No tenderness.  Musculoskeletal:        General: Normal range of motion.  Skin:    General: Skin is warm and dry.     Capillary Refill: Capillary refill takes less than 2 seconds.  Neurological:     General: No focal deficit present.     Mental Status: She is alert and oriented to person, place, and time.     Cranial Nerves: No cranial nerve deficit.  Psychiatric:        Mood and Affect: Mood normal.        Behavior: Behavior normal.        Thought Content: Thought content normal.        Judgment: Judgment normal.         Assessment And Plan:  Binge eating disorder, unspecified severity Assessment & Plan: Will increase her dose of Vyvanse, has helped however continues to have episodes of binge eating.   Orders: -      Lisdexamfetamine Dimesylate; Take 1 capsule (50 mg total) by mouth every morning.  Dispense: 30 capsule; Refill: 0  COVID-19 vaccination declined Assessment & Plan: Declines covid 19 vaccine.  Discussed risk of covid 37 and if she changes her mind about the vaccine to call the office. Education has been provided regarding the importance of this vaccine but patient still declined. Advised may receive this vaccine at local pharmacy or Health Dept.or vaccine clinic. Aware to provide a copy of the vaccination record if obtained from local pharmacy or Health Dept.  Encouraged to take multivitamin, vitamin d, vitamin c and zinc to increase immune system. Aware can call office if would like to have vaccine here at office. Verbalized acceptance and understanding.    Class 3 severe obesity due to excess calories with body mass index (BMI) of 50.0 to 59.9 in adult, unspecified whether serious comorbidity present [facility]) Assessment & Plan: She is encouraged to strive for BMI less than 30 to decrease cardiac risk. Advised to aim for at least 150 minutes of exercise per week.    Other fatigue Assessment & Plan: Will do a sleep study   Snoring Assessment & Plan: Will try to order home sleep study.    Concentration deficit Assessment & Plan: Will refer to psychiatry for further evaluation. She is already on vyvanse for binge eating and she notices a slight improvement with her concentration but not as much to normalize her day to day function  Orders: -     Ambulatory referral to Psychiatry   She can walk 3 days a week for 30 minutes   Return for 2 months weight check.  Patient was given opportunity to ask questions. Patient verbalized understanding of the plan and was able to repeat key elements of the plan. All questions were answered to their satisfaction.    Jeanell Sparrow, FNP, have reviewed all documentation for this visit. The documentation on 01/07/24 for the exam, diagnosis, procedures,  and orders are all accurate and complete.   IF YOU HAVE BEEN REFERRED TO A SPECIALIST, IT MAY TAKE 1-2 WEEKS TO SCHEDULE/PROCESS THE REFERRAL. IF YOU HAVE NOT HEARD FROM US/SPECIALIST IN TWO WEEKS, PLEASE GIVE [place] A CALL AT [phone].

## 2024-01-15 ENCOUNTER — Other Ambulatory Visit: Payer: Self-pay | Admitting: Nurse Practitioner

## 2024-01-15 ENCOUNTER — Encounter: Payer: Self-pay | Admitting: Nurse Practitioner

## 2024-01-15 DIAGNOSIS — R0683 Snoring: Secondary | ICD-10-CM

## 2024-01-15 DIAGNOSIS — R5383 Other fatigue: Secondary | ICD-10-CM

## 2024-01-15 DIAGNOSIS — E66813 Obesity, class 3: Secondary | ICD-10-CM

## 2024-01-15 DIAGNOSIS — Z6841 Body Mass Index (BMI) 40.0 and over, adult: Secondary | ICD-10-CM

## 2024-01-15 NOTE — Assessment & Plan Note (Signed)

## 2024-01-15 NOTE — Assessment & Plan Note (Signed)
 Will increase her dose of Vyvanse, has helped however continues to have episodes of binge eating.

## 2024-01-15 NOTE — Assessment & Plan Note (Signed)
 Will do a sleep study

## 2024-01-15 NOTE — Assessment & Plan Note (Signed)
 Will try to order home sleep study.

## 2024-01-15 NOTE — Assessment & Plan Note (Signed)
 She is encouraged to strive for BMI less than 30 to decrease cardiac risk. Advised to aim for at least 150 minutes of exercise per week.

## 2024-01-19 NOTE — Assessment & Plan Note (Signed)
 Will refer to psychiatry for further evaluation. She is already on vyvanse for binge eating and she notices a slight improvement with her concentration but not as much to normalize her day to day function

## 2024-01-21 ENCOUNTER — Other Ambulatory Visit: Payer: Self-pay

## 2024-01-21 ENCOUNTER — Other Ambulatory Visit (HOSPITAL_COMMUNITY): Payer: Self-pay

## 2024-02-10 ENCOUNTER — Institutional Professional Consult (permissible substitution): Admitting: Neurology

## 2024-02-12 ENCOUNTER — Encounter: Payer: Self-pay | Admitting: Neurology

## 2024-02-12 ENCOUNTER — Ambulatory Visit: Admitting: Neurology

## 2024-02-12 VITALS — BP 150/86 | HR 85 | Ht 62.0 in | Wt 328.2 lb

## 2024-02-12 DIAGNOSIS — R0683 Snoring: Secondary | ICD-10-CM

## 2024-02-12 DIAGNOSIS — Z6841 Body Mass Index (BMI) 40.0 and over, adult: Secondary | ICD-10-CM | POA: Diagnosis not present

## 2024-02-12 DIAGNOSIS — Z9189 Other specified personal risk factors, not elsewhere classified: Secondary | ICD-10-CM

## 2024-02-12 DIAGNOSIS — R0681 Apnea, not elsewhere classified: Secondary | ICD-10-CM | POA: Diagnosis not present

## 2024-02-12 DIAGNOSIS — R519 Headache, unspecified: Secondary | ICD-10-CM | POA: Diagnosis not present

## 2024-02-12 DIAGNOSIS — R351 Nocturia: Secondary | ICD-10-CM

## 2024-02-12 DIAGNOSIS — G4719 Other hypersomnia: Secondary | ICD-10-CM

## 2024-02-12 NOTE — Progress Notes (Signed)
 Subjective:    Patient ID: Meghan Griffin is a 43 y.o. female.  HPI    Debbra Fairy, MD, PhD [facility] [address], [address] [address] [place], [place] [address]  Dear Abelino Able,   I saw your patient, Meghan Griffin, upon your kind request in my sleep clinic today for initial consultation of her sleep disorder, in particular, concern for underlying obstructive sleep apnea.  The patient is unaccompanied today.  As you know, [name] is a 43 year old female with an underlying medical history of prediabetes, hyperlipidemia, eczema, and morbid obesity with a BMI of over 60, who reports snoring and excessive daytime somnolence, nonrestorative sleep and witnessed breathing pauses while asleep per mom.  Her Epworth score is 11 out of 24, fatigue severity score is 32 out of 63.  She is not aware of any family history of sleep apnea.  She reports a bedtime of around 10 PM and rise time around 6 AM.  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  She has nocturia about once per average night and has had occasional morning headaches.  She drinks caffeine in the form of soda, usually 1/day, she is a non-smoker and drinks alcohol about once a month.  She lives with her 50-year-old daughter, they have 1 dog in the household, the dog does not sleep in her bedroom.  She has a TV in her bedroom but it is typically not on at night.  She is working on weight loss.    Her Past Medical History Is Significant For: Past Medical History:  Diagnosis Date   Eczema    Hemorrhoids    History of gonorrhea 2006   treated   Obesity     Her Past Surgical History Is Significant For: Past Surgical History:  Procedure Laterality Date   CESAREAN SECTION N/A 11/30/2014   Procedure: CESAREAN SECTION;  Surgeon: Artemisa Bile, MD;  Location: [facility];  Service: Obstetrics;  Laterality: N/A;   TOOTH EXTRACTION      Her Family History Is Significant For: Family History   Problem Relation Age of Onset   Alcohol abuse Father    COPD Father    Hyperlipidemia Father    CAD Father 67       MI   Stroke Father 46   Hypertension Father    Diabetes Father    Hyperlipidemia Maternal Grandmother    Hypertension Maternal Grandmother    Hyperlipidemia Maternal Grandfather    Hypertension Maternal Grandfather    Cancer Other        great grandmother - bone marrow   Cancer Paternal Aunt        leukemia    Her Social History Is Significant For: Social History   Socioeconomic History   Marital status: Single    Spouse name: Not on file   Number of children: Not on file   Years of education: Not on file   Highest education level: Associate degree: occupational, Scientist, product/process development, or vocational program  Occupational History   Not on file  Tobacco Use   Smoking status: Never   Smokeless tobacco: Never  Substance and Sexual Activity   Alcohol use: No   Drug use: No   Sexual activity: Yes  Other Topics Concern   Not on file  Social History Narrative   Lives with boyfriend, 1 dog   Occupation: Fish farm manager   Edu: tech college   Activity: no regular exercise  Diet: good water, fruits/vegetables daily   Social Drivers of Health   Financial Resource Strain: Low Risk  (01/06/2024)   Overall Financial Resource Strain (CARDIA)    Difficulty of Paying Living Expenses: Not very hard  Food Insecurity: No Food Insecurity (01/06/2024)   Hunger Vital Sign    Worried About Running Out of Food in the Last Year: Never true    Ran Out of Food in the Last Year: Never true  Transportation Needs: No Transportation Needs (01/06/2024)   PRAPARE - Administrator, Civil Service (Medical): No    Lack of Transportation (Non-Medical): No  Physical Activity: Insufficiently Active (01/06/2024)   Exercise Vital Sign    Days of Exercise per Week: 5 days    Minutes of Exercise per Session: 10 min  Stress: No Stress Concern Present (01/06/2024)   [employer] of  Occupational Health - Occupational Stress Questionnaire    Feeling of Stress : Not at all  Social Connections: Socially Isolated (01/06/2024)   Social Connection and Isolation Panel [NHANES]    Frequency of Communication with Friends and Family: More than three times a week    Frequency of Social Gatherings with Friends and Family: More than three times a week    Attends Religious Services: Never    Database administrator or Organizations: No    Attends Engineer, structural: Not on file    Marital Status: Never married    Her Allergies Are:  Allergies  Allergen Reactions   Amoxicillin Diarrhea    Has patient had a PCN reaction causing immediate rash, facial/tongue/throat swelling, SOB or lightheadedness with hypotension: no Has patient had a PCN reaction causing severe rash involving mucus membranes or skin necrosis: no Has patient had a PCN reaction that required hospitalization: no Has patient had a PCN reaction occurring within the last 10 years: yes If all of the above answers are "NO", then may proceed with Cephalosporin use.    Justicia Adhatoda Anaphylaxis   Other Anaphylaxis and Swelling    Tree nuts   Shellfish-Derived Products Anaphylaxis    GI upset-lobster and crab  :   Her Current Medications Are:  Outpatient Encounter Medications as of 02/12/2024  Medication Sig   B Complex-C (SUPER B COMPLEX PO) Take by mouth.   Emollient (PALMERS COCOA BUTTER FORMULA) CREA Apply topically as directed.   escitalopram  (LEXAPRO ) 10 MG tablet Take 1 tablet (10 mg total) by mouth daily.   lisdexamfetamine (VYVANSE ) 50 MG capsule Take 1 capsule (50 mg total) by mouth every morning.   Magnesium 250 MG TABS Take by mouth.   menthol -zinc  oxide (GOLD  BOND) powder Apply topically 2 (two) times daily.   mometasone  (ELOCON ) 0.1 % cream Apply 1 Application topically daily.   nystatin  (MYCOSTATIN /NYSTOP ) powder Apply 1 Application topically 3 (three) times daily.   rosuvastatin   (CRESTOR ) 10 MG tablet Take 1 tablet (10 mg total) by mouth at bedtime.   topiramate  (TOPAMAX ) 50 MG tablet Take 1 tablet (50 mg total) by mouth daily.   Vitamin D , Ergocalciferol , (DRISDOL) 1.25 MG (50000 UNIT) CAPS capsule Take 50,000 Units by mouth every 7 (seven) days.   EPINEPHrine  (EPIPEN  2-PAK) 0.3 mg/0.3 mL IJ SOAJ injection Inject 0.3 mg into the muscle as needed for anaphylaxis. (Patient not taking: Reported on 02/12/2024)   No facility-administered encounter medications on file as of 02/12/2024.  :   Review of Systems:  Out of a complete 14 point review of systems, all are reviewed and  negative with the exception of these symptoms as listed below:  Review of Systems  Neurological:        Room 5 Pt is here Alone. Pt states that she has snored for as long as she could remember. Pt denies ever waking up out of breathe. Pt states that she may get 4-8 hours of sleep. Pt states some mornings she feels rested, and some she doesn't. Pt states that her fatigue throughout the day is manageable. ESS 11 FSS 32     Objective:  Neurological Exam  Physical Exam Physical Examination:   Vitals:   02/12/24 1538  BP: (!) 150/86  Pulse: 85    General Examination: The patient is a very pleasant 43 y.o. female in no acute distress. She appears well-developed and well-nourished and well groomed.   HEENT: Normocephalic, atraumatic, pupils are equal, round and reactive to light, extraocular tracking is good without limitation to gaze excursion or nystagmus noted. Hearing is grossly intact. Face is symmetric with normal facial animation. Speech is clear with no dysarthria noted. There is no hypophonia. There is no lip, neck/head, jaw or voice tremor. Neck is supple with full range of passive and active motion. There are no carotid bruits on auscultation. Oropharynx exam reveals: mild mouth dryness, adequate dental hygiene, significant airway crowding secondary to small airway entry and tonsillar size  of about 3+ bilaterally, Mallampati class III.   Chest: Clear to auscultation without wheezing, rhonchi or crackles noted.  Heart: S1+S2+0, regular and normal without murmurs, rubs or gallops noted.   Abdomen: Soft, non-tender and non-distended.  Extremities: There is no pitting edema in the distal lower extremities bilaterally.   Skin: Warm and dry without trophic changes noted.   Musculoskeletal: exam reveals no obvious joint deformities.   Neurologically:  Mental status: The patient is awake, alert and oriented in all 4 spheres. Her immediate and remote memory, attention, language skills and fund of knowledge are appropriate. There is no evidence of aphasia, agnosia, apraxia or anomia. Speech is clear with normal prosody and enunciation. Thought process is linear. Mood is normal and affect is normal.  Cranial nerves II - XII are as described above under HEENT exam.  Motor exam: Normal bulk, strength and tone is noted. There is no obvious action or resting tremor.  Fine motor skills and coordination: grossly intact.  Cerebellar testing: No dysmetria or intention tremor. There is no truncal or gait ataxia.  Sensory exam: intact to light touch in the upper and lower extremities.  Gait, station and balance: She stands easily. No veering to one side is noted. No leaning to one side is noted. Posture is age-appropriate and stance is narrow based. Gait shows normal stride length and normal pace. No problems turning are noted.   Assessment and plan:   In summary, Francessca A Yarde is a very pleasant 43 y.o.-year old female with an underlying medical history of prediabetes, hyperlipidemia, eczema, and morbid obesity with a BMI of over 60, whose history and physical exam are concerning for sleep disordered breathing, particularly obstructive sleep apnea (OSA). A laboratory attended sleep study is typically considered "gold  standard" for evaluation of sleep disordered breathing.   I had a long chat  with the patient about my findings and the diagnosis of sleep apnea, particularly OSA, its prognosis and treatment options. We talked about medical/conservative treatments, surgical interventions and non-pharmacological approaches for symptom control. I explained, in particular, the risks and ramifications of untreated moderate to severe OSA, especially with respect  to developing cardiovascular disease down the road, including congestive heart failure (CHF), difficult to treat hypertension, cardiac arrhythmias (particularly A-fib), neurovascular complications including TIA, stroke and dementia. Even type 2 diabetes has, in part, been linked to untreated OSA. Symptoms of untreated OSA may include (but may not be limited to) daytime sleepiness, nocturia (i.e. frequent nighttime urination), memory problems, mood irritability and suboptimally controlled or worsening mood disorder such as depression and/or anxiety, lack of energy, lack of motivation, physical discomfort, as well as recurrent headaches, especially morning or nocturnal headaches. We talked about the importance of maintaining a healthy lifestyle and striving for healthy weight. In addition, we talked about the importance of striving for and maintaining good sleep hygiene. I recommended a sleep study at this time. I outlined the differences between a laboratory attended sleep study which is considered more comprehensive and accurate over the option of a home sleep test (HST); the latter may lead to underestimation of sleep disordered breathing in some instances and does not help with diagnosing upper airway resistance syndrome and is not accurate enough to diagnose primary central sleep apnea typically. I outlined possible surgical and non-surgical treatment options of OSA, including the use of a positive airway pressure (PAP) device (i.e. CPAP, AutoPAP/APAP or BiPAP in certain circumstances), a custom-made dental device (aka oral appliance, which would  require a referral to a specialist dentist or orthodontist typically, and is generally speaking not considered for patients with full dentures or edentulous state), upper airway surgical options, such as traditional UPPP (which is not considered a first-line treatment) or the Inspire device (hypoglossal nerve stimulator, which would involve a referral for consultation with an ENT surgeon, after careful selection, following inclusion criteria - also not first-line treatment). I explained the PAP treatment option to the patient in detail, as this is generally considered first-line treatment.  The patient indicated that she would be willing to try PAP therapy, if the need arises. I explained the importance of being compliant with PAP treatment, not only for insurance purposes but primarily to improve patient's symptoms symptoms, and for the patient's long term health benefit, including to reduce Her cardiovascular risks longer-term.    We will pick up our discussion about the next steps and treatment options after testing.  We will keep her posted as to the test results by phone call and/or MyChart messaging where possible.  We will plan to follow-up in sleep clinic accordingly as well.  I answered all her questions today and the patient  was in agreement.   I encouraged her to call with any interim questions, concerns, problems or updates or email us  through MyChart.  Generally speaking, sleep test authorizations may take up to 2 weeks, sometimes less, sometimes longer, the patient is encouraged to get in touch with us  if they do not hear back from the sleep lab staff directly within the next 2 weeks.  Thank you very much for allowing me to participate in the care of this nice patient. If I can be of any further assistance to you please do not hesitate to call me at [phone].  Sincerely,   Debbra Fairy, MD, PhD

## 2024-02-12 NOTE — Patient Instructions (Signed)

## 2024-02-20 ENCOUNTER — Other Ambulatory Visit (HOSPITAL_COMMUNITY): Payer: Self-pay

## 2024-02-20 ENCOUNTER — Other Ambulatory Visit: Payer: Self-pay | Admitting: Nurse Practitioner

## 2024-02-20 DIAGNOSIS — F50819 Binge eating disorder, unspecified: Secondary | ICD-10-CM

## 2024-02-20 MED ORDER — LISDEXAMFETAMINE DIMESYLATE 50 MG PO CAPS
50.0000 mg | ORAL_CAPSULE | ORAL | 0 refills | Status: DC
  Filled 2024-02-20: qty 30, 30d supply, fill #0

## 2024-02-24 ENCOUNTER — Encounter (INDEPENDENT_AMBULATORY_CARE_PROVIDER_SITE_OTHER): Payer: Self-pay | Admitting: Adult Health

## 2024-02-24 ENCOUNTER — Ambulatory Visit (INDEPENDENT_AMBULATORY_CARE_PROVIDER_SITE_OTHER): Admitting: Adult Health

## 2024-02-24 ENCOUNTER — Telehealth: Payer: Self-pay | Admitting: Neurology

## 2024-02-24 VITALS — BP 105/59 | HR 98 | Temp 98.7°F | Ht 62.0 in | Wt 323.0 lb

## 2024-02-24 DIAGNOSIS — E559 Vitamin D deficiency, unspecified: Secondary | ICD-10-CM

## 2024-02-24 DIAGNOSIS — Z0289 Encounter for other administrative examinations: Secondary | ICD-10-CM

## 2024-02-24 DIAGNOSIS — Z6841 Body Mass Index (BMI) 40.0 and over, adult: Secondary | ICD-10-CM | POA: Diagnosis not present

## 2024-02-24 DIAGNOSIS — Z0000 Encounter for general adult medical examination without abnormal findings: Secondary | ICD-10-CM

## 2024-02-24 NOTE — Telephone Encounter (Signed)
 NPSG & HST [employer] [facility] no auth req for either codes.  Sent mychart

## 2024-02-24 NOTE — Telephone Encounter (Signed)
 NPSG [facility] [employer] no auth req   Patient is scheduled at [facility] for 03/31/24 at 8 pm.  Mailed packet and sent mychart

## 2024-02-24 NOTE — Progress Notes (Unsigned)
 Office: [phone]  /  Fax: [phone]   Initial Visit  Meghan Griffin was seen in clinic today to evaluate for obesity. She is interested in losing weight to improve overall health and reduce the risk of weight related complications. She presents today to review program treatment options, initial physical assessment, and evaluation.     Discussed the use of AI scribe software for clinical note transcription with the patient, who gave verbal consent to proceed.  History of Present Illness        She was referred by: PCP  When asked what else they would like to accomplish? She states: Adopt healthier eating patterns, Improve energy levels and physical activity, Improve existing medical conditions, Reduce number of medications, Improve quality of life, and Improve appearance  When asked how has your weight affected you? She states: Relationships, Contributed to medical problems, Contributed to orthopedic problems or mobility issues, Having fatigue, Having poor endurance, and Problems with eating patterns  Weight history: Lifelong struggle with elevated weight.  Some associated conditions: Hypertension, Hyperlipidemia, OSA, and Vitamin D  Deficiency  Contributing factors: Family history of obesity, Disruption of circadian rhythm / sleep disordered breathing, Consumption of processed foods, Moderate to high levels of stress, and Reduced physical activity  Weight promoting medications identified: None  Current nutrition plan: Other: She tries not consume foods after 1700  Current level of physical activity: NEAT  Current or previous pharmacotherapy: Topiramate  and Other: Vyvanse   Response to medication:  Has been taking Topiramate /Vyanse combo > 1 year, has lost approximately 20 lbs   Past medical history includes:   Past Medical History:  Diagnosis Date   Eczema    Hemorrhoids    History of gonorrhea 2006   treated   Obesity      Objective:   BP (!) 105/59   Pulse  98   Temp 98.7 F (37.1 C)   Ht 5\' 2"  (1.575 m)   Wt (!) 323 lb (146.5 kg)   SpO2 95%   BMI 59.08 kg/m  She was weighed on the bioimpedance scale: Body mass index is 59.08 kg/m.   Peak Weight:345 , Body Fat%:57.7, Visceral Fat Rating:24, Weight trend over the last 12 months: Decreasing  General:  Alert, oriented and cooperative. Patient is in no acute distress.  Respiratory: Normal respiratory effort, no problems with respiration noted   Gait: able to ambulate independently  Mental Status: Normal mood and affect. Normal behavior. Normal judgment and thought content.   DIAGNOSTIC DATA REVIEWED:  BMET    Component Value Date/Time   NA 141 06/25/2023 1702   K 3.9 06/25/2023 1702   CL 106 06/25/2023 1702   CO2 24 06/25/2023 1702   GLUCOSE 100 (H) 06/25/2023 1702   GLUCOSE 80 12/25/2013 1048   BUN 10 06/25/2023 1702   CREATININE 0.74 06/25/2023 1702   CALCIUM  9.3 06/25/2023 1702   Lab Results  Component Value Date   HGBA1C 5.9 (H) 06/25/2023   HGBA1C 5.7 12/25/2013   Lab Results  Component Value Date   INSULIN  17.9 11/12/2023   CBC    Component Value Date/Time   WBC 8.7 06/25/2023 1702   WBC 7.7 08/29/2016 0309   RBC 4.20 06/25/2023 1702   RBC 3.88 08/29/2016 0309   HGB 12.5 06/25/2023 1702   HCT 38.3 06/25/2023 1702   PLT 254 06/25/2023 1702   MCV 91 06/25/2023 1702   MCH 29.8 06/25/2023 1702   MCH 29.6 08/29/2016 0309   MCHC 32.6 06/25/2023 1702  MCHC 33.0 08/29/2016 0309   RDW 12.9 06/25/2023 1702   Iron/TIBC/Ferritin/ %Sat No results found for: "IRON", "TIBC", "FERRITIN", "IRONPCTSAT" Lipid Panel     Component Value Date/Time   CHOL 190 11/12/2023 0910   TRIG 95 11/12/2023 0910   HDL 47 11/12/2023 0910   CHOLHDL 4.0 11/12/2023 0910   CHOLHDL 6 12/25/2013 1048   VLDL 18.2 12/25/2013 1048   LDLCALC 126 (H) 11/12/2023 0910   Hepatic Function Panel     Component Value Date/Time   PROT 8.0 12/25/2013 1048   ALBUMIN 3.6 12/25/2013 1048   AST 15  12/25/2013 1048   ALT 13 12/25/2013 1048   ALKPHOS 55 12/25/2013 1048   BILITOT 0.5 12/25/2013 1048      Component Value Date/Time   TSH 2.300 12/26/2022 1641     Assessment and Plan:   Healthcare maintenance  Vitamin D  deficiency  Morbid obesity (HCC), STARTING BMI 59.1    Assessment and Plan      ESTABLISH WITH [facility]    Obesity Treatment / Action Plan:  Patient will work on garnering support from family and friends to begin weight loss journey. Will work on eliminating or reducing the presence of highly palatable, calorie dense foods in the home. Will complete provided nutritional and psychosocial assessment questionnaire before the next appointment. Will be scheduled for indirect calorimetry to determine resting energy expenditure in a fasting state.  This will allow us  to create a reduced calorie, high-protein meal plan to promote loss of fat mass while preserving muscle mass. Counseled on the health benefits of losing 5%-15% of total body weight. Was counseled on nutritional approaches to weight loss and benefits of reducing processed foods and consuming plant-based foods and high quality protein as part of nutritional weight management. Was counseled on pharmacotherapy and role as an adjunct in weight management.   Obesity Education Performed Today:  She was weighed on the bioimpedance scale and results were discussed and documented in the synopsis.  We discussed obesity as a disease and the importance of a more detailed evaluation of all the factors contributing to the disease.  We discussed the importance of long term lifestyle changes which include nutrition, exercise and behavioral modifications as well as the importance of customizing this to her specific health and social needs.  We discussed the benefits of reaching a healthier weight to alleviate the symptoms of existing conditions and reduce the risks of the biomechanical, metabolic and psychological effects of  obesity.  Meghan Griffin appears to be in the action stage of change and states they are ready to start intensive lifestyle modifications and behavioral modifications.  I have spent 30 minutes in the care of the patient today including: preparing to see patient (e.g. review and interpretation of tests, old notes ), obtaining and/or reviewing separately obtained history, performing a medically appropriate examination or evaluation, counseling and educating the patient, documenting clinical information in the electronic or other health care record, and independently interpreting results and communicating results to the patient, family, or caregiver  Discussed emotional eating habits  Reviewed by clinician on day of visit: allergies, medications, problem list, medical history, surgical history, family history, social history, and previous encounter notes pertinent to obesity diagnosis.  Johnika Escareno d. Raunak Antuna, NP-C

## 2024-03-09 ENCOUNTER — Ambulatory Visit: Admitting: Nurse Practitioner

## 2024-03-09 ENCOUNTER — Other Ambulatory Visit (HOSPITAL_COMMUNITY): Payer: Self-pay

## 2024-03-09 ENCOUNTER — Encounter: Payer: Self-pay | Admitting: Nurse Practitioner

## 2024-03-09 VITALS — BP 124/80 | HR 75 | Temp 98.7°F | Ht 62.0 in | Wt 327.4 lb

## 2024-03-09 DIAGNOSIS — R7303 Prediabetes: Secondary | ICD-10-CM | POA: Diagnosis not present

## 2024-03-09 DIAGNOSIS — Z6841 Body Mass Index (BMI) 40.0 and over, adult: Secondary | ICD-10-CM

## 2024-03-09 DIAGNOSIS — F50819 Binge eating disorder, unspecified: Secondary | ICD-10-CM | POA: Diagnosis not present

## 2024-03-09 DIAGNOSIS — L0292 Furuncle, unspecified: Secondary | ICD-10-CM | POA: Diagnosis not present

## 2024-03-09 DIAGNOSIS — E66813 Obesity, class 3: Secondary | ICD-10-CM

## 2024-03-09 DIAGNOSIS — Z2821 Immunization not carried out because of patient refusal: Secondary | ICD-10-CM | POA: Diagnosis not present

## 2024-03-09 MED ORDER — LISDEXAMFETAMINE DIMESYLATE 50 MG PO CAPS
50.0000 mg | ORAL_CAPSULE | ORAL | 0 refills | Status: DC
  Filled 2024-05-01: qty 30, 30d supply, fill #0

## 2024-03-09 MED ORDER — LISDEXAMFETAMINE DIMESYLATE 50 MG PO CAPS
50.0000 mg | ORAL_CAPSULE | ORAL | 0 refills | Status: DC
  Filled 2024-06-08: qty 30, 30d supply, fill #0

## 2024-03-09 MED ORDER — CEPHALEXIN 500 MG PO CAPS
500.0000 mg | ORAL_CAPSULE | Freq: Four times a day (QID) | ORAL | 0 refills | Status: AC
  Filled 2024-03-09: qty 40, 10d supply, fill #0

## 2024-03-09 MED ORDER — LISDEXAMFETAMINE DIMESYLATE 50 MG PO CAPS
50.0000 mg | ORAL_CAPSULE | ORAL | 0 refills | Status: DC
  Filled 2024-03-09 – 2024-03-30 (×3): qty 30, 30d supply, fill #0

## 2024-03-09 NOTE — Progress Notes (Unsigned)
 [place] Favia, CMA,acting as a Neurosurgeon for Meghan Epley, FNP.,have documented all relevant documentation on the behalf of Meghan Epley, FNP,as directed by  Meghan Epley, FNP while in the presence of Meghan Epley, FNP.  Subjective:  Patient ID: Meghan Griffin , female    DOB: [birth year] , 43 y.o.   MRN: [id number]  Chief Complaint  Patient presents with   Obesity    Patient presents today for a weight follow up, Patient reports compliance with medication. Patient denies any chest pain, SOB, or headaches. Patient has no concerns today.    Mass    Patient reports she has a spot on her right hip, she reports it is now leaking fluids and and it is painful sometimes.     HPI  She is taking vyvanse  for binge eating. [facility] is [facility] to [facility] her next appt is June. Her sleep apnea test is on the June 10th. She is not exercising regularly. She did go to the zoo during spring break. She feels the binging is better but still eating bad.      Past Medical History:  Diagnosis Date   Eczema    Hemorrhoids    History of gonorrhea 2006   treated   Obesity      Family History  Problem Relation Age of Onset   Alcohol abuse Father    COPD Father    Hyperlipidemia Father    CAD Father 37       MI   Stroke Father 46   Hypertension Father    Diabetes Father    Hyperlipidemia Maternal Grandmother    Hypertension Maternal Grandmother    Hyperlipidemia Maternal Grandfather    Hypertension Maternal Grandfather    Cancer Other        great grandmother - bone marrow   Cancer Paternal Aunt        leukemia     Current Outpatient Medications:    B Complex-C (SUPER B COMPLEX PO), Take by mouth., Disp: , Rfl:    cephALEXin  (KEFLEX ) 500 MG capsule, Take 1 capsule (500 mg total) by mouth 4 (four) times daily for 10 days., Disp: 40 capsule, Rfl: 0   Emollient (PALMERS COCOA BUTTER FORMULA) CREA, Apply topically as directed., Disp: 434 g, Rfl: 0   EPINEPHrine  (EPIPEN  2-PAK) 0.3  mg/0.3 mL IJ SOAJ injection, Inject 0.3 mg into the muscle as needed for anaphylaxis., Disp: 2 each, Rfl: 2   escitalopram  (LEXAPRO ) 10 MG tablet, Take 1 tablet (10 mg total) by mouth daily., Disp: 90 tablet, Rfl: 1   levonorgestrel  (MIRENA , 52 MG,) 20 MCG/DAY IUD, provided by care center, Disp: , Rfl:    lisdexamfetamine (VYVANSE ) 50 MG capsule, Take 1 capsule (50 mg total) by mouth every morning., Disp: 30 capsule, Rfl: 0   Magnesium 250 MG TABS, Take by mouth., Disp: , Rfl:    menthol -zinc  oxide (GOLD  BOND) powder, Apply topically 2 (two) times daily., Disp: 113 g, Rfl: 0   mometasone  (ELOCON ) 0.1 % cream, Apply 1 Application topically daily., Disp: 45 g, Rfl: 0   nystatin  (MYCOSTATIN /NYSTOP ) powder, Apply 1 Application topically 3 (three) times daily., Disp: 15 g, Rfl: 0   rosuvastatin  (CRESTOR ) 10 MG tablet, Take 1 tablet (10 mg total) by mouth at bedtime., Disp: 90 tablet, Rfl: 1   topiramate  (TOPAMAX ) 50 MG tablet, Take 1 tablet (50 mg total) by mouth daily., Disp: 90 tablet, Rfl: 1   Vitamin D , Ergocalciferol , (DRISDOL) 1.25 MG (50000 UNIT) CAPS  capsule, Take 50,000 Units by mouth every 7 (seven) days., Disp: , Rfl:    Vitamins-Lipotropics (COMPLEX B-100-INOSITOL) TBCR, Take by mouth., Disp: , Rfl:    Allergies  Allergen Reactions   Amoxicillin Diarrhea    Has patient had a PCN reaction causing immediate rash, facial/tongue/throat swelling, SOB or lightheadedness with hypotension: no Has patient had a PCN reaction causing severe rash involving mucus membranes or skin necrosis: no Has patient had a PCN reaction that required hospitalization: no Has patient had a PCN reaction occurring within the last 10 years: yes If all of the above answers are "NO", then may proceed with Cephalosporin use.    Justicia Adhatoda Anaphylaxis   Other Anaphylaxis and Swelling    Tree nuts   Shellfish-Derived Products Anaphylaxis    GI upset-lobster and crab     Review of Systems  Constitutional:  Negative.   Respiratory: Negative.    Cardiovascular: Negative.   Musculoskeletal: Negative.   Neurological: Negative.   Psychiatric/Behavioral: Negative.       Today's Vitals   03/09/24 1040  BP: 124/80  Pulse: 75  Temp: 98.7 F (37.1 C)  TempSrc: Oral  Weight: (!) 327 lb 6.4 oz (148.5 kg)  Height: 5\' 2"  (1.575 m)  PainSc: 0-No pain   Body mass index is 59.88 kg/m.  Wt Readings from Last 3 Encounters:  03/09/24 (!) 327 lb 6.4 oz (148.5 kg)  02/24/24 (!) 323 lb (146.5 kg)  02/12/24 (!) 328 lb 3.2 oz (148.9 kg)     Objective:  Physical Exam Vitals and nursing note reviewed.  Constitutional:      General: She is not in acute distress.    Appearance: Normal appearance. She is well-developed. She is obese.  Cardiovascular:     Rate and Rhythm: Normal rate and regular rhythm.     Pulses: Normal pulses.     Heart sounds: Normal heart sounds. No murmur heard. Pulmonary:     Effort: Pulmonary effort is normal. No respiratory distress.     Breath sounds: Normal breath sounds. No wheezing.  Chest:     Chest wall: No tenderness.  Musculoskeletal:        General: Normal range of motion.  Skin:    General: Skin is warm and dry.     Capillary Refill: Capillary refill takes less than 2 seconds.     Comments: Boil to right underneath her panniculum  Neurological:     General: No focal deficit present.     Mental Status: She is alert and oriented to person, place, and time.     Cranial Nerves: No cranial nerve deficit.  Psychiatric:        Mood and Affect: Mood normal.        Behavior: Behavior normal.        Thought Content: Thought content normal.        Judgment: Judgment normal.      Assessment And Plan:  Binge eating disorder, unspecified severity  Class 3 severe obesity due to excess calories with body mass index (BMI) of 50.0 to 59.9 in adult, unspecified whether serious comorbidity present  COVID-19 vaccination declined  Boil -     Cephalexin ; Take 1 capsule  (500 mg total) by mouth 4 (four) times daily for 10 days.  Dispense: 40 capsule; Refill: 0    Return for 2 months weight check.  Patient was given opportunity to ask questions. Patient verbalized understanding of the plan and was able to repeat key elements of the  plan. All questions were answered to their satisfaction.    Inge Mangle, FNP, have reviewed all documentation for this visit. The documentation on 03/09/24 for the exam, diagnosis, procedures, and orders are all accurate and complete.   IF YOU HAVE BEEN REFERRED TO A SPECIALIST, IT MAY TAKE 1-2 WEEKS TO SCHEDULE/PROCESS THE REFERRAL. IF YOU HAVE NOT HEARD FROM US /SPECIALIST IN TWO WEEKS, PLEASE GIVE US  A CALL AT [phone].

## 2024-03-11 LAB — WOUND CULTURE: Organism ID, Bacteria: NONE SEEN

## 2024-03-18 ENCOUNTER — Ambulatory Visit: Payer: Self-pay | Admitting: Nurse Practitioner

## 2024-03-18 DIAGNOSIS — L0292 Furuncle, unspecified: Secondary | ICD-10-CM | POA: Insufficient documentation

## 2024-03-18 NOTE — Assessment & Plan Note (Signed)

## 2024-03-18 NOTE — Assessment & Plan Note (Signed)
HgbA1c was 5.9 at last visit, continue focusing on healthy diet and encouraged her to exercise regularly

## 2024-03-18 NOTE — Assessment & Plan Note (Signed)
 Continue current dose, discussed importance of being mindful of what she is eating.

## 2024-03-18 NOTE — Assessment & Plan Note (Signed)
 She has an open boil to panniculus, will treat with cephalexin .

## 2024-03-18 NOTE — Assessment & Plan Note (Signed)
 She is encouraged to strive for BMI less than 30 to decrease cardiac risk. Advised to aim for at least 150 minutes of exercise per week.

## 2024-03-30 ENCOUNTER — Other Ambulatory Visit (HOSPITAL_COMMUNITY): Payer: Self-pay

## 2024-03-31 ENCOUNTER — Ambulatory Visit (INDEPENDENT_AMBULATORY_CARE_PROVIDER_SITE_OTHER): Admitting: Neurology

## 2024-03-31 DIAGNOSIS — G4719 Other hypersomnia: Secondary | ICD-10-CM

## 2024-03-31 DIAGNOSIS — G4733 Obstructive sleep apnea (adult) (pediatric): Secondary | ICD-10-CM

## 2024-03-31 DIAGNOSIS — Z6841 Body Mass Index (BMI) 40.0 and over, adult: Secondary | ICD-10-CM

## 2024-03-31 DIAGNOSIS — R519 Headache, unspecified: Secondary | ICD-10-CM

## 2024-03-31 DIAGNOSIS — R351 Nocturia: Secondary | ICD-10-CM

## 2024-03-31 DIAGNOSIS — R0681 Apnea, not elsewhere classified: Secondary | ICD-10-CM

## 2024-03-31 DIAGNOSIS — Z9189 Other specified personal risk factors, not elsewhere classified: Secondary | ICD-10-CM

## 2024-03-31 DIAGNOSIS — R0683 Snoring: Secondary | ICD-10-CM

## 2024-03-31 DIAGNOSIS — G4720 Circadian rhythm sleep disorder, unspecified type: Secondary | ICD-10-CM

## 2024-04-13 ENCOUNTER — Ambulatory Visit: Payer: Self-pay | Admitting: Neurology

## 2024-04-13 ENCOUNTER — Encounter (INDEPENDENT_AMBULATORY_CARE_PROVIDER_SITE_OTHER): Payer: Self-pay

## 2024-04-13 DIAGNOSIS — G4733 Obstructive sleep apnea (adult) (pediatric): Secondary | ICD-10-CM

## 2024-04-13 NOTE — Procedures (Signed)
 Physician Interpretation:     [facility] SPLIT NIGHT INTERPRETATION REPORT   STUDY DATE: 03/31/2024     PATIENT NAME:  Meghan Griffin, Meghan Griffin         DATE OF BIRTH:  [birth year]  PATIENT ID:  [id number]    TYPE OF STUDY:  SPLIT  READING PHYSICIAN: True Mar, MD, PhD SCORING TECHNICIAN: Sheena Fields      Referred by: Georgina Speaks, FNP  ? History and Indication for Testing: 43 year old female with an underlying medical history of prediabetes, hyperlipidemia, eczema, and morbid obesity with a BMI of over 60, who reports snoring and excessive daytime somnolence, nonrestorative sleep and witnessed breathing pauses while asleep. Her Epworth score is 11 out of 24, fatigue severity score is 32 out of 63. Height: 62.0 in Weight: 328 lb (BMI 59) Neck Size: 0.0 in    Medications: B Complex, Palmers Cocoa Butter, Lexapro , Vyvanse , Magnesium, Gold  Bond, Elocon , Mycostatin , Crestor , Topamax , Vitamin D   DESCRIPTION: A sleep technologist was in attendance for the duration of the recording.  Data collection, scoring, video monitoring, and reporting were performed in compliance with the AASM Manual for the Scoring of Sleep and Associated Events; (Hypopnea is scored based on the criteria listed in Section VIII D. 1b in the AASM Manual V2.6 using a 4% oxygen desaturation rule or Hypopnea is scored based on the criteria listed in Section VIII D. 1a in the AASM Manual V2.6 using 3% oxygen desaturation and /or arousal rule).  A physician certified by the American Board of Sleep Medicine reviewed each epoch of the study.   FINDINGS:  Please refer to the attached summary for additional quantitative information.  STUDY DETAILS: Lights off was at 22:13: and lights on 05:17: (424 minutes hours in bed). This study was performed with an initial diagnostic portion followed by positive airway pressure titration.  DIAGNOSTIC ANALYSIS   SLEEP CONTINUITY AND SLEEP ARCHITECTURE:  The  diagnostic portion of the study began at 22:13 and ended at 01:48, for a recording time of 3h 35.31m minutes.  Total sleep time was 144 minutes minutes (9.3% supine;  72.0% lateral;  18.7% prone, 0.0% REM sleep), with a decreased sleep efficiency at 67.1%. Sleep latency was normal at 20.0 minutes. REM sleep latency was decreased at 0.0 minutes.  Arousal index was 26.2 /hr. Of the total sleep time, the percentage of stage N1 sleep was 14.2%, stage N2 sleep was 71.3%, stage N3 sleep was 14.5%, and REM sleep was 0.0%. There were 0 Stage R periods observed during this portion of the study, 18 awakenings (i.e. transitions to Stage W from any sleep stage), and 55.0 total stage transitions. Wake after sleep onset (WASO) time accounted for 51 minutes.   AROUSAL (Baseline): There were 53.0 arousals in total, for an arousal index of 22.0 arousals/hour.  Of these, 42.0 were identified as respiratory-related arousals (17.4 /hr), 0 were PLM-related arousals (0.0 /hr), and 21 were non-specific arousals (8.7 /hr)   RESPIRATORY MONITORING:  Based on CMS criteria (using a 4% oxygen desaturation rule for scoring hypopneas), there were 2 apneas (2 obstructive; 0 central; 0 mixed), and 98 hypopneas.  Apnea index was 0.4. Hypopnea index was 31.1. The apnea-hypopnea index was 31.6 overall (6.2 supine; 0.0 REM, 0.0 supine REM). There were 0 respiratory effort-related arousals (RERAs).  The RERA index was 0.0 events/hr. Total respiratory disturbance index (RDI) was 31.6 events/hr. RDI results showed: supine RDI  66.7 /hr; non-supine RDI 27.9 /hr; REM RDI 0.0 /hr, supine REM  RDI 0.0 /hr.  Based on AASM criteria (using a 3% oxygen desaturation and /or arousal rule for scoring hypopneas), there were 2 apneas (2 obstructive; 0 central; 0 mixed), and 98 hypopneas.  Apnea index was 0.4. Hypopnea index was 38.6. The apnea-hypopnea index was 39.0 overall (7.1 supine; 0.0 REM, 0.0 supine REM). There were 0 respiratory effort-related arousals  (RERAs). Total respiratory disturbance index (RDI) was 39.0 events/hr. RDI results showed: supine RDI 75.6 /hr; non-supine RDI 35.3 /hr; REM RDI 0.0 /hr, supine REM RDI 0.0 /hr.   Respiratory events were associated with oxyhemoglobin desaturations (nadir 81%) from a normal baseline (mean 94%). Total time spent at, or below 88% was 26.8 minutes, or 18.5%  of total sleep time. Snoring was absent. There were 0.0 occurrences of Cheyne Stokes breathing.   LIMB MOVEMENTS: There were 0 periodic limb movements of sleep (0.0/hr), of which 0 (0.0/hr) were associated with an arousal.   OXIMETRY: Total sleep time spent at, or below 88% was 22.8 minutes, or 15.8% of total sleep time. Snoring was classified as .   BODY POSITION: Duration of total sleep and percent of total sleep in their respective position is as follows: supine 13 minutes minutes (9.3%), non-supine 131.0 minutes (90.7%); right 104 minutes minutes (72.0%), left 00 minutes minutes (0.0%), and prone 27 minutes minutes (18.7%). Total supine REM sleep time was 00 minutes minutes (0.0% of total REM sleep).   Analysis of electrocardiogram activity showed the highest heart rate for the baseline portion of the study was 102.0 beats per minute.  The average heart rate during sleep was 93 bpm, while the highest heart rate for the same period was 101 bpm.    TREATMENT ANALYSIS SLEEP CONTINUITY AND SLEEP ARCHITECTURE:  The treatment portion of the study began at 01:48 and ended at 05:17, for a recording time of 3h 28.66m minutes.  Total sleep time was 153 minutes minutes (32.4% supine;  67.6% lateral; 0.0% prone, 24.8% REM sleep), with a decreased sleep efficiency at 73.4%. Sleep latency was increased at 33.5 minutes. REM sleep latency was increased at 137.0 minutes.  Arousal index was 14.1 /hr. Of the total sleep time, the percentage of stage N1 sleep was 18.3%, stage N3 sleep was 8.5%, and REM sleep was 24.8%. There were 1 Stage R periods observed during this  portion of the study, 18 awakenings (i.e. transitions to Stage W from any sleep stage), and 56.0 total stage transitions. Wake after sleep onset (WASO) time accounted for 22 minutes.   AROUSAL: There were 23.0 arousals in total, for an arousal index of 9.0 arousals/hour.  Of these, 11.0 were identified as respiratory-related arousals (4.3 /hr), 0 were PLM-related arousals (0.0 /hr), and 25 were non-specific arousals (9.8 /hr)  RESPIRATORY MONITORING:    While on PAP therapy, based on CMS criteria, the apnea-hypopnea index was 9.4 overall (2.7 supine; 1.6 REM).   While on PAP therapy, based on AASM criteria, the apnea-hypopnea index was 14.5 overall (4.7 supine; 3.5 REM).   Respiratory events were associated with oxyhemoglobin desaturation (nadir 86.0%) from a mean of 97.0%.  Total time spent at, or below 88% was 0.6 minutes, or 0.4%  of total sleep time.  Snoring was absent:  . There were 0.0 occurrences of Cheyne Stokes breathing.  LIMB MOVEMENTS: There were 0 periodic limb movements of sleep (0.0/hr), of which 0 (0.0/hr) were associated with an arousal.   OXIMETRY: Total sleep time spent at, or below 88% was 0.2 minutes, or 0.1% of total sleep  time. Snoring was classified as .   BODY POSITION: Duration of total sleep and percent of total sleep in their respective position is as follows: supine 49 minutes minutes (32.4%), non-supine 103.5 minutes (67.6%); right 103 minutes minutes (67.6%), left 00 minutes minutes (0.0%), and prone 00 minutes minutes (0.0%). Total supine REM sleep time was 38 minutes minutes (100.0% of total REM sleep).   Analysis of electrocardiogram activity showed the highest heart rate for the treatment portion of the study was 103.0 beats per minute.  The average heart rate during sleep was 86 bpm, while the highest heart rate for the same period was 95 bpm.   TITRATION DETAILS (SEE ALSO TABLE AT THE END OF THE REPORT):    The patient qualified for an emergency split study  due to severe sleep disordered breathing.  Her baseline AHI was 39/h, O2 nadir 81%.  The patient was fitted with a extra small fullface mask from [employer] [facility]), and started on CPAP of 5 cm without EPR.  She was titrated to a final pressure of 13 cm with EPR of 2.  On this setting she achieved a total sleep time of only 6.5 minutes, AHI 0/h, O2 nadir of 89% during supine REM sleep.    EEG: Review of the EEG showed no abnormal electrical discharges and symmetrical bihemispheric findings.      EKG: The EKG revealed normal sinus rhythm (NSR).  The average heart rate during the baseline portion of the study was 93 bpm and 86 bpm during the treatment portion of the study.  AUDIO/VIDEO REVIEW: The audio and video review did not show any abnormal or unusual behaviors, movements, phonations or vocalizations. The patient took no restroom breaks. Snoring was noted, mostly in the mild range, at times moderate, improved with PAP therapy.    POST-STUDY QUESTIONNAIRE: Post study, the patient indicated, that sleep was better than usual.     IMPRESSION:    1. Severe obstructive Sleep Apnea (OSA) 2. Dysfunctions associated with sleep stages or arousal from sleep   RECOMMENDATIONS:    1. This patient has severe obstructive sleep apnea. The patient qualified for an emergency split sleep study per AASM standards. The baseline AHI was 39/h, and O2 nadir 81%.  Of note, the absence of REM sleep during the baseline portion of the study and minimal supine sleep achieved likely underestimated her sleep disordered breathing.  The patient responded well to PAP therapy. CPAP of 13 cm resulted in significant reduction of her sleep disordered breathing.  I, therefore, recommend home CPAP therapy at a pressure of 13 cm via XS Evora fullface mask with heated humidity (or mask of choice, sized to fit, EPR or 2 or as per tolerance). The patient will be advised to be fully compliant with PAP therapy to improve sleep related  symptoms and decrease long term cardiovascular risks. Please note, that untreated obstructive sleep apnea may carry additional perioperative morbidity. Patients with significant obstructive sleep apnea should receive perioperative PAP therapy and the surgeons and particularly the anesthesiologist should be informed of the diagnosis and the severity of the sleep disordered breathing. 2. This study shows moderate sleep fragmentation and abnormal sleep stage percentages; these findings improved during the titration portion of the study.  These are nonspecific findings and per se do not signify an intrinsic sleep disorder or a cause for the patient's sleep-related symptoms. Causes include (but are not limited to) the first night effect of the sleep study, circadian rhythm disturbances, medication effect or  an underlying mood disorder or medical problem.  3. The patient should be cautioned not to drive, work at heights, or operate dangerous or heavy equipment when tired or sleepy. Review and reiteration of good sleep hygiene measures should be pursued with any patient. 4. The patient will be seen in follow-up in the sleep clinic at [facility] for discussion of the test results, symptom and treatment compliance review, further management strategies, etc. The referring provider will be notified of the test results.   I certify that I have reviewed the entire raw data recording prior to the issuance of this report in accordance with the Standards of Accreditation of the American Academy of Sleep Medicine (AASM).  True Mar, MD, PhD Medical Director, [facility]) Diplomat, ABPN (Neurology and Sleep)              Technical Report:   [facility] Split Summary    General Information  Name: Meghan Griffin, Meghan Griffin BMI: 40.00 Physician: True Mar, MD  ID: [id number] Height: 62.0 in Technician: Jesusa Haddock, RPSGT  Sex: Female Weight:  328.0 lb Record: [id number]  Age: 21 [Nov 18, 1980] Date: 03/31/2024     Medical & Medication History    43 year old female with an underlying medical history of prediabetes, hyperlipidemia, eczema, and morbid obesity with a BMI of over 60, who reports snoring and excessive daytime somnolence, nonrestorative sleep and witnessed breathing pauses while asleep per mom. B Complex, Palmers Cocoa Butter, Lexapro , Vyvanse , Magnesium, Gold  Bond, Elocon , Mycostatin , Crestor , Topamax , Vitamin D    Sleep Disorder      Comments   The patient came into the sleep lab for a PSG. The patient was split due to having an AHI greater than 40. The patient was fitted with [employer] Evora (FFM) size XS. This mask was a good fit to face and the patient tolerate the mask well. This is the interface the patient uses at home. CPAP was initiated at [facility] with no EPR. CPAP was titrated up to 13cmH2O with EPR of 2 due to respiratory events. No restroom breaks. EKG did not show any obvious cardiac arrhythmias. Mild snoring. All sleep stages witnessed. Respiratory events scored with a 3% desat. The patient slept prone and lateral. AHI was 34.8 after 2 hrs of TST.    Baseline Sleep Stage Information Baseline start time: 10:13:04 PM Baseline end time: 01:48:32 AM   Time Total Supine Side Prone Upright  Recording 3h 35.72m 0h 19.27m 2h 45.38m 0h 31.82m 0h 0.32m  Sleep 2h 24.48m 0h 13.82m 1h 44.3m 0h 27.50m 0h 0.5m   Latency N1 N2 N3 REM Onset Per. Slp. Eff.  Actual 0h 0.33m 0h 10.78m 0h 37.32m 0h 0.41m 0h 20.90m 0h 28.41m 67.05%   Stg Dur Wake N1 N2 N3 REM  Total 71.0 20.5 103.0 21.0 0.0  Supine 5.5 5.5 8.0 0.0 0.0  Side 61.0 10.5 72.5 21.0 0.0  Prone 4.5 4.5 22.5 0.0 0.0  Upright 0.0 0.0 0.0 0.0 0.0   Stg % Wake N1 N2 N3 REM  Total 32.9 14.2 71.3 14.5 0.0  Supine 2.6 3.8 5.5 0.0 0.0  Side 28.3 7.3 50.2 14.5 0.0  Prone 2.1 3.1 15.6 0.0 0.0  Upright 0.0 0.0 0.0 0.0 0.0    CPAP Sleep Stage Information CPAP start time: 01:48:32 AM CPAP  end time: 05:17:07 AM   Time Total Supine Side Prone Upright  Recording (TRT) 3h 28.41m 1h 2.87m 2h 26.48m 0h 0.46m 0h 0.23m  Sleep (TST)  2h 33.7m 0h 49.63m 1h 43.87m 0h 0.31m 0h 0.71m   Latency N1 N2 N3 REM Onset Per. Slp. Eff.  Actual 0h 0.82m 0h 6.17m 1h 34.36m 2h 17.61m 0h 33.18m 1h 29.69m 73.38%   Stg Dur Wake N1 N2 N3 REM  Total 55.5 28.0 74.0 13.0 38.0  Supine 12.5 1.5 10.0 0.0 38.0  Side 43.0 26.5 64.0 13.0 0.0  Prone 0.0 0.0 0.0 0.0 0.0  Upright 0.0 0.0 0.0 0.0 0.0   Stg % Wake N1 N2 N3 REM  Total 26.6 18.3 48.4 8.5 24.8  Supine 6.0 1.0 6.5 0.0 24.8  Side 20.6 17.3 41.8 8.5 0.0  Prone 0.0 0.0 0.0 0.0 0.0  Upright 0.0 0.0 0.0 0.0 0.0    Baseline Respiratory Information Apnea Summary Sub Supine Side Prone Upright  Total 1 Total 1 1 0 0 0    REM 0 0 0 0 0    NREM 1 1 0 0 0  Obs 1 REM 0 0 0 0 0    NREM 1 1 0 0 0  Mix 0 REM 0 0 0 0 0    NREM 0 0 0 0 0  Cen 0 REM 0 0 0 0 0    NREM 0 0 0 0 0   Rera Summary Sub Supine Side Prone Upright  Total 0 Total 0 0 0 0 0    REM 0 0 0 0 0    NREM 0 0 0 0 0   Hypopnea Summary Sub Supine Side Prone Upright  Total 93 Total 93 16 49 28 0    REM 0 0 0 0 0    NREM 93 16 49 28 0   4% Hypopnea Summary Sub Supine Side Prone Upright  Total (4%) 75 Total 75 14 38 23 0    REM 0 0 0 0 0    NREM 75 14 38 23 0     AHI Total Obs Mix Cen  39.03 Apnea 0.42 0.42 0.00 0.00   Hypopnea 38.62 -- -- --  31.56 Hypopnea (4%) 31.14 -- -- --    Total Supine Side Prone Upright  Position AHI 39.03 75.56 28.27 62.22 0.00  REM AHI 0.00   NREM AHI 39.03   Position RDI 39.03 75.56 28.27 62.22 0.00  REM RDI 0.00   NREM RDI 39.03    4% Hypopnea Total Supine Side Prone Upright  Position AHI (4%) 31.56 66.67 21.92 51.11 0.00  REM AHI (4%) 0.00   NREM AHI (4%) 31.56   Position RDI (4%) 31.56 66.67 21.92 51.11 0.00  REM RDI (4%) 0.00   NREM RDI (4%) 31.56    CPAP Respiratory Information Apnea Summary Sub Supine Side Prone Upright  Total 1 Total 1 0 1 0 0     REM 0 0 0 0 0    NREM 1 0 1 0 0  Obs 1 REM 0 0 0 0 0    NREM 1 0 1 0 0  Mix 0 REM 0 0 0 0 0    NREM 0 0 0 0 0  Cen 0 REM 0 0 0 0 0    NREM 0 0 0 0 0   Rera Summary Sub Supine Side Prone Upright  Total 0 Total 0 0 0 0 0    REM 0 0 0 0 0    NREM 0 0 0 0 0   Hypopnea Summary Sub Supine Side Prone Upright  Total 36 Total 36 12 24  0 0    REM 9 9 0 0 0    NREM 27 3 24  0 0   4% Hypopnea Summary Sub Supine Side Prone Upright  Total (4%) 23 Total 23 7 16  0 0    REM 4 4 0 0 0    NREM 19 3 16  0 0     AHI Total Obs Mix Cen  14.51 Apnea 0.39 0.39 0.00 0.00   Hypopnea 14.12 -- -- --  9.41 Hypopnea (4%) 9.02 -- -- --    Total Supine Side Prone Upright  Position AHI 14.51 14.55 14.49 0.00 0.00  REM AHI 14.21   NREM AHI 14.61   Position RDI 14.51 14.55 14.49 0.00 0.00  REM RDI 14.21   NREM RDI 14.61    4% Hypopnea Total Supine Side Prone Upright  Position AHI (4%) 9.41 8.48 9.86 0.00 0.00  REM AHI (4%) 6.32   NREM AHI (4%) 10.43   Position RDI (4%) 9.41 8.48 9.86 0.00 0.00  REM RDI (4%) 6.32   NREM RDI (4%) 10.43    Desaturation Information (Baseline)  <100% <90% <80% <70% <60% <50% <40%  Supine 19 9 0 0 0 0 0  Side 65 32 0 0 0 0 0  Prone 29 27 0 0 0 0 0  Upright 0 0 0 0 0 0 0  Total 113 68 0 0 0 0 0  Desaturation threshold setting: 3% Minimum desaturation setting: 10 seconds SaO2 nadir: 81% The longest event was a 65 sec obstructive Hypopneawith a minimum SaO2 of 88%. The lowest SaO2 was 50% associated with a 35 sec obstructive Hypopnea. Awakening/Arousal Information (Baseline) # of Awakenings 18  Wake after sleep onset 51.59m  Wake after persistent sleep 45.59m   Arousal Assoc. Arousals Index  Apneas 1 0.4  Hypopneas 41 17.0  Leg Movements 0 0.0  Snore 0.0 0.0  PTT Arousals 0 0.0  Spontaneous 21 8.7  Total 63 26.2   Desaturation Information (CPAP)  <100% <90% <80% <70% <60% <50% <40%  Supine 15 1 0 0 0 0 0  Side 28 5 0 0 0 0 0  Prone 0 0 0 0 0 0 0  Upright 0  0 0 0 0 0 0  Total 43 6 0 0 0 0 0  Desaturation threshold setting: 3% Minimum desaturation setting: 10 seconds SaO2 nadir: 86% The longest event was a 42 sec obstructive Hypopnea with a minimum SaO2 of 92%. The lowest SaO2 was 86% associated with a 39 sec obstructive Hypopnea. Awakening/Arousal Information (CPAP) # of Awakenings 18  Wake after sleep onset 22.28m  Wake after persistent sleep 5.68m   Arousal Assoc. Arousals Index  Apneas 1 0.4  Hypopneas 10 3.9  Leg Movements 0 0.0  Snore 0.0 0.0  PTT Arousals 0 0.0  Spontaneous 25 9.8  Total 36 14.1     EKG Rates (Baseline) EKG Avg Max Min  Awake 89 102 77  Asleep 93 101 81  EKG Events: Tachycardia Myoclonus Information (Baseline) PLMS LMs Index  Total LMs during PLMS 0 0.0  LMs w/ Microarousals 0 0.0   LM LMs Index  w/ Microarousal 0 0.0  w/ Awakening 0 0.0  w/ Resp Event 0 0.0  Spontaneous 0 0.0  Total 0 0.0   EKG Rates (CPAP) EKG Avg Max Min  Awake 85 103 78  Asleep 86 95 77  EKG Events: Tachycardia Myoclonus Information (CPAP) PLMS LMs Index  Total LMs during PLMS 0 0.0  LMs w/ Microarousals 0 0.0   LM LMs Index  w/ Microarousal 0 0.0  w/ Awakening 0 0.0  w/ Resp Event 0 0.0  Spontaneous 0 0.0  Total 0 0.0      Titration Table:  [facility] CPAP/Bilevel Report    General Information  Name: Yariana, Hoaglund BMI: 40 Physician: Buck Saucer  ID: [id number] Height: 62 in Technician: Harvey Brisker  Sex: Female Weight: 328 lb Record: [phone]  Age: 36 [25-Jan-1981] Date: 03/31/2024 Scorer: Brisker Fields   Recommended Settings IPAP: N/A cmH20 EPAP: N/A cmH2O AHI: N/A AHI (4%): N/A   Pressure IPAP/EPAP 00 05 06 07 08 10 11 12    O2 Vol 0.0 0.0 0.0 0.0 0.0 0.0 0.0 0.0  Time TRT 215.66m 49.104m 14.42m 23.51m 7.70m 69.56m 17.56m 21.6m   TST 144.11m 13.73m 8.60m 15.63m 6.48m 64.63m 17.71m 21.77m  Sleep Stage % Wake 32.9 72.4 39.3 34.8 13.3 7.9 0.0 0.0   % REM 0.0 0.0 0.0 0.0 0.0  0.0 57.1 100.0   % N1 14.2 63.0 47.1 70.0 15.4 6.3 0.0 0.0   % N2 71.3 37.0 52.9 30.0 84.6 73.4 42.9 0.0   % N3 14.5 0.0 0.0 0.0 0.0 20.3 0.0 0.0  Respiratory Total Events 94 6 7 9 2 4 3 6    Obs. Apn. 1 0 1 0 0 0 0 0   Mixed Apn. 0 0 0 0 0 0 0 0   Cen. Apn. 0 0 0 0 0 0 0 0   Hypopneas 93 6 6 9 2 4 3 6    AHI 39.03 26.67 49.41 36.00 18.46 3.75 10.29 16.74   Supine AHI 75.56 0.00 0.00 0.00 0.00 45.00 10.29 16.74   Prone AHI 62.22 0.00 0.00 0.00 0.00 0.00 0.00 0.00   Side AHI 28.27 26.67 49.41 36.00 18.46 1.00 0.00 0.00  Respiratory (4%) Hypopneas (4%) 75.00 3.00 5.00 6.00 1.00 4.00 2.00 2.00   AHI (4%) 31.56 13.33 42.35 24.00 9.23 3.75 6.86 5.58   Supine AHI (4%) 66.67 0.00 0.00 0.00 0.00 45.00 6.86 5.58   Prone AHI (4%) 51.11 0.00 0.00 0.00 0.00 0.00 0.00 0.00   Side AHI (4%) 21.92 13.33 42.35 24.00 9.23 1.00 0.00 0.00  Desat Profile <= 90% 79.61m 0.64m 0.3m 0.19m 0.40m 0.36m 0.42m 0.52m   <= 80% 2.7m 0.66m 0.32m 0.36m 0.76m 0.32m 0.18m 0.41m   <= 70% 1.28m 0.25m 0.67m 0.13m 0.56m 0.92m 0.26m 0.68m   <= 60% 1.71m 0.38m 0.24m 0.48m 0.22m 0.23m 0.29m 0.73m  Arousal Index Apnea 0.4 0.0 7.1 0.0 0.0 0.0 0.0 0.0   Hypopnea 17.0 13.3 0.0 20.0 9.2 0.9 0.0 0.0   LM 0.0 0.0 0.0 0.0 0.0 0.0 0.0 0.0   Spontaneous 8.7 8.9 42.4 24.0 0.0 5.6 6.9 5.6   Pressure IPAP/EPAP 13   O2 Vol 0.0  Time TRT 6.8m   TST 6.3m  Sleep Stage % Wake 0.0   % REM 100.0   % N1 0.0   % N2 0.0   % N3 0.0  Respiratory Total Events 0   Obs. Apn. 0   Mixed Apn. 0   Cen. Apn. 0   Hypopneas 0   AHI 0.00   Supine AHI 0.00   Prone AHI 0.00   Side AHI 0.00  Respiratory (4%) Hypopneas (4%) 0.00   AHI (4%) 0.00   Supine AHI (4%) 0.00   Prone AHI (4%) 0.00   Side AHI (4%) 0.00  Desat Profile <= 90% 0.60m   <=  80% 0.69m   <= 70% 0.14m   <= 60% 0.1m  Arousal Index Apnea 0.0   Hypopnea 0.0   LM 0.0   Spontaneous 9.2

## 2024-04-14 NOTE — Telephone Encounter (Signed)
LVM for patient to call back to discuss sleep study results

## 2024-04-14 NOTE — Telephone Encounter (Signed)
-----   Message from True Mar sent at 04/13/2024  5:54 PM EDT ----- Urgent set up requested on PAP therapy, due to severe OSA. Patient referred by PCP, seen by me on 02/12/24, patient had a split night sleep study on 03/31/24. Please call and notify patient that the recent sleep study showed severe obstructive sleep apnea (OSA). She did well with CPAP during the study with significant improvement of the respiratory events.  I would like start the patient on a CPAP machine for home use. I placed the order in the chart.  Please advise patient that we will need a follow up appointment with either myself or one of our nurse practitioners in about 2-3 months post set-up to check for how they are doing on treatment and  how well it's going with the machine in general. Most insurance company require a certain compliance percentage to continue to cover/pay for the machine. Please ask patient to schedule this FU  appointment, according to the set-up date, which is the day they receive the machine. Please make sure, the patient understands the importance of keeping this window for the FU appointment, as it is  often an Barista and not our rule. Failing to adhere to this may result in losing coverage for sleep apnea treatment, at which point some insurances require repeating the whole process.  Plus, monitoring compliance data is usually good feedback for the patient as far as how they are doing, how many hours they are on it, how well the mask fits, etc.  Also remind patient, that any PAP machine or mask issues should be first addressed with the DME company, who provided the machine/supplies.  Please ask if patient has a preference regarding DME company, may depend on the insurance too.  True Mar, MD, PhD [facility])   ----- Message ----- From: Mar True, MD Sent: 04/13/2024   5:51 PM EDT To: True Mar, MD

## 2024-04-15 ENCOUNTER — Encounter (INDEPENDENT_AMBULATORY_CARE_PROVIDER_SITE_OTHER): Payer: Self-pay | Admitting: Family Medicine

## 2024-04-15 ENCOUNTER — Ambulatory Visit (INDEPENDENT_AMBULATORY_CARE_PROVIDER_SITE_OTHER): Admitting: Family Medicine

## 2024-04-15 VITALS — BP 136/82 | HR 94 | Temp 97.8°F | Ht 62.0 in | Wt 321.0 lb

## 2024-04-15 DIAGNOSIS — E782 Mixed hyperlipidemia: Secondary | ICD-10-CM

## 2024-04-15 DIAGNOSIS — R5383 Other fatigue: Secondary | ICD-10-CM | POA: Diagnosis not present

## 2024-04-15 DIAGNOSIS — F50814 Binge eating disorder, in remission: Secondary | ICD-10-CM | POA: Diagnosis not present

## 2024-04-15 DIAGNOSIS — Z6841 Body Mass Index (BMI) 40.0 and over, adult: Secondary | ICD-10-CM

## 2024-04-15 DIAGNOSIS — R0602 Shortness of breath: Secondary | ICD-10-CM | POA: Diagnosis not present

## 2024-04-15 DIAGNOSIS — R7303 Prediabetes: Secondary | ICD-10-CM

## 2024-04-15 DIAGNOSIS — G4733 Obstructive sleep apnea (adult) (pediatric): Secondary | ICD-10-CM | POA: Diagnosis not present

## 2024-04-15 DIAGNOSIS — E559 Vitamin D deficiency, unspecified: Secondary | ICD-10-CM

## 2024-04-15 NOTE — Assessment & Plan Note (Signed)
 This patient has severe obstructive sleep apnea. The patient qualified for an emergency split sleep study per AASM standards. The baseline AHI was 39/h, and O2 nadir 81%.  Of note, the absence of REM sleep during the baseline portion of the study and minimal supine sleep achieved likely underestimated her sleep disordered breathing.  Waiting on CPAP.

## 2024-04-15 NOTE — Progress Notes (Signed)
 Chief Complaint:  Obesity   Subjective:  Meghan Griffin (MR# [id number]) is a 43 y.o. female who presents for evaluation and treatment of obesity and related comorbidities.   Meghan Griffin is currently in the action stage of change and ready to dedicate time achieving and maintaining a healthier weight. Meghan Griffin is interested in becoming our patient and working on intensive lifestyle modifications including (but not limited to) diet and exercise for weight loss.  Meghan Griffin has been struggling with her weight. She has been unsuccessful in either losing weight, maintaining weight loss, or reaching her healthy weight goal.  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  She is living at home with her daughter Meghan Griffin who is 28.  She is not a picky eater. Her desired weight is 250lbs and last time she was that weight was 10 years ago.  She voices she gained excessive weight after breastfeeding- she was around 240lbs at that time.  Eats out 4 times a week at [employer], [employer], [employer].  Food Recall: Water in the am.  Around 9am she has a pepsi zero.  10am she has a tuna pack and crackers 6-7- eating because she hasn't eaten yet and feels satisfied.  12pm she will heat up a lean cuisine and more crackers (whole wheat saltines)- feels satisfied.  For dinner may go to her grandmother's and have chicken and dumplings- 2 cups. Felt full.   Indirect Calorimeter completed today shows a RMR: 2174  .  Her calculated basal metabolic rate is kcal is 2058 thus her basal metabolic rate is unchanged than expected.  Other Fatigue Meghan Griffin admits to daytime somnolence and admits to waking up still tired. Patient has a history of symptoms of daytime fatigue, morning fatigue, and morning headache. Meghan Griffin generally gets 6 hours of sleep per night, and states that she has nightime awakenings. Snoring is present. Apneic episodes are present. Epworth Sleepiness Score is 10.   Shortness of  Breath Meghan Griffin notes increasing shortness of breath with exercising and seems to be worsening over time with weight gain. She notes getting out of breath sooner with activity than she used to. This has not gotten worse recently. Meghan Griffin denies shortness of breath at rest or orthopnea.  Depression Screen Meghan Griffin's Food and Mood (modified PHQ-9) score was 7.     04/15/2024    9:09 AM  Depression screen PHQ 2/9  Decreased Interest 0  Down, Depressed, Hopeless 1  PHQ - 2 Score 1  Altered sleeping 1  Tired, decreased energy 2  Change in appetite 2  Feeling bad or failure about yourself  0  Trouble concentrating 1  Moving slowly or fidgety/restless 0  Suicidal thoughts 0  PHQ-9 Score 7  Difficult doing work/chores Not difficult at all     Objective:  Vitals Temp: 97.8 F (36.6 C) BP: 136/82 Pulse Rate: 94 SpO2: 96 %   Anthropometric Measurements Height: 5' 2 (1.575 m) Weight: (!) 321 lb (145.6 kg) BMI (Calculated): 58.7 Starting Weight: 321 lb Peak Weight: 345 lb Waist Measurement : 59 inches   Body Composition  Body Fat %: 57.2 % Fat Mass (lbs): 183.8 lbs Muscle Mass (lbs): 130.6 lbs Visceral Fat Rating : 24   Other Clinical Data RMR: 2174 Fasting: yes Labs: yes Today's Visit #: 1 Comments: 1st Visit    EKG: Normal sinus rhythm, rate 83 bpm.  General: Cooperative, alert, well developed, in no acute distress. HEENT: Conjunctivae and lids unremarkable. Cardiovascular: Regular rhythm.  Lungs: Normal work of breathing. Neurologic: No focal deficits.   Lab Results  Component Value Date   CREATININE 0.74 06/25/2023   BUN 10 06/25/2023   NA 141 06/25/2023   K 3.9 06/25/2023   CL 106 06/25/2023   CO2 24 06/25/2023   Lab Results  Component Value Date   ALT 13 12/25/2013   AST 15 12/25/2013   ALKPHOS 55 12/25/2013   BILITOT 0.5 12/25/2013   Lab Results  Component Value Date   HGBA1C 5.9 (H) 06/25/2023   HGBA1C 5.9 (H) 12/26/2022   HGBA1C 5.7  12/25/2013   Lab Results  Component Value Date   INSULIN  17.9 11/12/2023   Lab Results  Component Value Date   TSH 2.300 12/26/2022   Lab Results  Component Value Date   CHOL 190 11/12/2023   HDL 47 11/12/2023   LDLCALC 126 (H) 11/12/2023   TRIG 95 11/12/2023   CHOLHDL 4.0 11/12/2023   Lab Results  Component Value Date   WBC 8.7 06/25/2023   HGB 12.5 06/25/2023   HCT 38.3 06/25/2023   MCV 91 06/25/2023   PLT 254 06/25/2023   No results found for: IRON, TIBC, FERRITIN  Assessment and Plan:   Other Fatigue  Meghan Griffin does feel that her weight is causing her energy to be lower than it should be. Fatigue may be related to obesity, depression or many other causes. Labs will be ordered, and in the meanwhile, Meghan Griffin will focus on self care including making healthy food choices, increasing physical activity and focusing on stress reduction.  Shortness of Breath  Meghan Griffin does feel that she gets out of breath more easily that she used to when she exercises. Meghan Griffin's shortness of breath appears to be obesity related and exercise induced. She has agreed to work on weight loss and gradually increase exercise to treat her exercise induced shortness of breath. Will continue to monitor closely.   Problem List Items Addressed This Visit       Respiratory   OSA (obstructive sleep apnea)   This patient has severe obstructive sleep apnea. The patient qualified for an emergency split sleep study per AASM standards. The baseline AHI was 39/h, and O2 nadir 81%.  Of note, the absence of REM sleep during the baseline portion of the study and minimal supine sleep achieved likely underestimated her sleep disordered breathing.  Waiting on CPAP.        Other   Prediabetes   Patient has had prediabetes for a significant length of time.  Last A1c of 5.9 in September of 2024.  Not on any medication and has never been on medication.  CMP, A1c and Insulin  level today.      Relevant Orders    Comprehensive metabolic panel with GFR   Hemoglobin A1c   Insulin , random   Binge eating disorder   On Vyvanse  daily.  Feels like this has been helpful with her concentration as well.  She has been on this medication for about 3 years now.      Mixed hyperlipidemia   Diagnosed and treated with rosuvastatin  for 3-4 years now.  No side effects.  Last LDL still slightly elevated.  FLP today.      Relevant Orders   Lipid panel   Other fatigue - Primary   Relevant Orders   EKG 12-Lead   Vitamin B12   Folate   T3   T4, free   TSH   Vitamin D  deficiency   On Vitamin D  OTC.  No nausea, vomiting or muscle weakness.  Need a repeat Vitamin D  level today to ensure patient has not been over replaced.      Relevant Orders   VITAMIN D  25 Hydroxy (Vit-D Deficiency, Fractures)   Other Visit Diagnoses       SOBOE (shortness of breath on exertion)       Relevant Orders   CBC with Differential/Platelet     Morbid obesity (HCC), STARTING BMI 59.1         BMI 50.0-59.9, adult (HCC)           Meghan Griffin is currently in the action stage of change and her goal is to continue with weight loss efforts. I recommend Meghan Griffin begin the structured treatment plan as follows:  She has agreed to Category 3 Plan  Exercise goals: All adults should avoid inactivity. Some activity is better than none, and adults who participate in any amount of physical activity, gain some health benefits.  Behavioral modification strategies:increasing lean protein intake, decreasing simple carbohydrates, increasing vegetables, avoiding temptations, and planning for success  She was informed of the importance of frequent follow-up visits to maximize her success with intensive lifestyle modifications for her multiple health conditions. She was informed we would discuss her lab results at her next visit unless there is a critical issue that needs to be addressed sooner. Meghan Griffin agreed to keep her next visit at the agreed upon  time to discuss these results.  Labs ordered with plans to discuss at the next visit.   Attestation Statements:  Reviewed by clinician on day of visit: allergies, medications, problem list, medical history, surgical history, family history, social history, and previous encounter notes.  This is the patient's first visit at [facility]. The patient's NEW PATIENT PACKET was reviewed at length. Included in the packet: current and past health history, medications, allergies, ROS, gynecologic history (women only), surgical history, family history, social history, weight history, weight loss surgery history (for those that have had weight loss surgery), nutritional evaluation, mood and food questionnaire, PHQ9, Epworth questionnaire, sleep habits questionnaire, patient life and health improvement goals questionnaire. These will all be scanned into the patient's chart under media.   During the visit, I independently reviewed the patient's EKG, bioimpedance scale results, and indirect calorimeter results. I used this information to tailor a meal plan for the patient that will help her to lose weight and will improve her obesity-related conditions going forward. I performed a medically necessary appropriate examination and/or evaluation. I discussed the assessment and treatment plan with the patient. The patient was provided an opportunity to ask questions and all were answered. The patient agreed with the plan and demonstrated an understanding of the instructions. Labs were ordered at this visit and will be reviewed at the next visit unless more critical results need to be addressed immediately. Clinical information was updated and documented in the EMR.    Meghan Cho, MD

## 2024-04-15 NOTE — Assessment & Plan Note (Signed)
 On Vyvanse  daily.  Feels like this has been helpful with her concentration as well.  She has been on this medication for about 3 years now.

## 2024-04-15 NOTE — Assessment & Plan Note (Signed)
 Patient has had prediabetes for a significant length of time.  Last A1c of 5.9 in September of 2024.  Not on any medication and has never been on medication.  CMP, A1c and Insulin  level today.

## 2024-04-15 NOTE — Assessment & Plan Note (Signed)
 On Vitamin D  OTC.  No nausea, vomiting or muscle weakness.  Need a repeat Vitamin D  level today to ensure patient has not been over replaced.

## 2024-04-15 NOTE — Assessment & Plan Note (Signed)
 Diagnosed and treated with rosuvastatin  for 3-4 years now.  No side effects.  Last LDL still slightly elevated.  FLP today.

## 2024-04-16 LAB — COMPREHENSIVE METABOLIC PANEL WITH GFR
ALT: 15 IU/L (ref 0–32)
AST: 14 IU/L (ref 0–40)
Albumin: 4.2 g/dL (ref 3.9–4.9)
Alkaline Phosphatase: 101 IU/L (ref 44–121)
BUN/Creatinine Ratio: 13 (ref 9–23)
BUN: 10 mg/dL (ref 6–24)
Bilirubin Total: 0.7 mg/dL (ref 0.0–1.2)
CO2: 20 mmol/L (ref 20–29)
Calcium: 9.7 mg/dL (ref 8.7–10.2)
Chloride: 100 mmol/L (ref 96–106)
Creatinine, Ser: 0.75 mg/dL (ref 0.57–1.00)
Globulin, Total: 3.8 g/dL (ref 1.5–4.5)
Glucose: 85 mg/dL (ref 70–99)
Potassium: 4.7 mmol/L (ref 3.5–5.2)
Sodium: 138 mmol/L (ref 134–144)
Total Protein: 8 g/dL (ref 6.0–8.5)
eGFR: 101 mL/min/{1.73_m2} (ref >59)

## 2024-04-16 LAB — CBC WITH DIFFERENTIAL/PLATELET
Basophils Absolute: 0 10*3/uL (ref 0.0–0.2)
Basos: 1 %
EOS (ABSOLUTE): 0.1 10*3/uL (ref 0.0–0.4)
Eos: 2 %
Hematocrit: 40.5 % (ref 34.0–46.6)
Hemoglobin: 12.9 g/dL (ref 11.1–15.9)
Immature Grans (Abs): 0 10*3/uL (ref 0.0–0.1)
Immature Granulocytes: 0 %
Lymphocytes Absolute: 2.1 10*3/uL (ref 0.7–3.1)
Lymphs: 31 %
MCH: 29.9 pg (ref 26.6–33.0)
MCHC: 31.9 g/dL (ref 31.5–35.7)
MCV: 94 fL (ref 79–97)
Monocytes Absolute: 0.4 10*3/uL (ref 0.1–0.9)
Monocytes: 6 %
Neutrophils Absolute: 4.1 10*3/uL (ref 1.4–7.0)
Neutrophils: 60 %
Platelets: 263 10*3/uL (ref 150–450)
RBC: 4.31 x10E6/uL (ref 3.77–5.28)
RDW: 13.6 % (ref 11.7–15.4)
WBC: 6.9 10*3/uL (ref 3.4–10.8)

## 2024-04-16 LAB — LIPID PANEL
Chol/HDL Ratio: 4.8 ratio — ABNORMAL HIGH (ref 0.0–4.4)
Cholesterol, Total: 198 mg/dL (ref 100–199)
HDL: 41 mg/dL (ref >39)
LDL Chol Calc (NIH): 140 mg/dL — ABNORMAL HIGH (ref 0–99)
Triglycerides: 94 mg/dL (ref 0–149)
VLDL Cholesterol Cal: 17 mg/dL (ref 5–40)

## 2024-04-16 LAB — VITAMIN D 25 HYDROXY (VIT D DEFICIENCY, FRACTURES): Vit D, 25-Hydroxy: 72.8 ng/mL (ref 30.0–100.0)

## 2024-04-16 LAB — T4, FREE: Free T4: 1.27 ng/dL (ref 0.82–1.77)

## 2024-04-16 LAB — INSULIN, RANDOM: INSULIN: 17.8 u[IU]/mL (ref 2.6–24.9)

## 2024-04-16 LAB — T3: T3, Total: 128 ng/dL (ref 71–180)

## 2024-04-16 LAB — HEMOGLOBIN A1C
Est. average glucose Bld gHb Est-mCnc: 120 mg/dL
Hgb A1c MFr Bld: 5.8 % — ABNORMAL HIGH (ref 4.8–5.6)

## 2024-04-16 LAB — VITAMIN B12: Vitamin B-12: 487 pg/mL (ref 232–1245)

## 2024-04-16 LAB — FOLATE: Folate: 4.5 ng/mL (ref >3.0)

## 2024-04-16 LAB — TSH: TSH: 2.35 u[IU]/mL (ref 0.450–4.500)

## 2024-04-16 NOTE — Telephone Encounter (Signed)
 Called pt and relayed results of sleep study. Severe osa, she had cpap titration when in for sleep test.  Start on cpap, will send to DME [facility] and insurance auth to be done and her contacted.  Once she receives machine will use 4 + more hours nightly. See back in 2-3 months for compliance appt, made 07-09-2024 at 0900 with Greig Forbes, NP.  She verbalized understanding.

## 2024-04-16 NOTE — Telephone Encounter (Signed)
-----   Message from True Mar sent at 04/13/2024  5:54 PM EDT ----- Urgent set up requested on PAP therapy, due to severe OSA. Patient referred by PCP, seen by me on 02/12/24, patient had a split night sleep study on 03/31/24. Please call and notify patient that the recent sleep study showed severe obstructive sleep apnea (OSA). She did well with CPAP during the study with significant improvement of the respiratory events.  I would like start the patient on a CPAP machine for home use. I placed the order in the chart.  Please advise patient that we will need a follow up appointment with either myself or one of our nurse practitioners in about 2-3 months post set-up to check for how they are doing on treatment and  how well it's going with the machine in general. Most insurance company require a certain compliance percentage to continue to cover/pay for the machine. Please ask patient to schedule this FU  appointment, according to the set-up date, which is the day they receive the machine. Please make sure, the patient understands the importance of keeping this window for the FU appointment, as it is  often an Barista and not our rule. Failing to adhere to this may result in losing coverage for sleep apnea treatment, at which point some insurances require repeating the whole process.  Plus, monitoring compliance data is usually good feedback for the patient as far as how they are doing, how many hours they are on it, how well the mask fits, etc.  Also remind patient, that any PAP machine or mask issues should be first addressed with the DME company, who provided the machine/supplies.  Please ask if patient has a preference regarding DME company, may depend on the insurance too.  True Mar, MD, PhD [facility])   ----- Message ----- From: Mar True, MD Sent: 04/13/2024   5:51 PM EDT To: True Mar, MD

## 2024-04-20 NOTE — Telephone Encounter (Signed)
 RE: new cpap user osa severe Received: 3 days ago Salick, Verneita Neysa Nena GORMAN, RN; Lavon Milling; Zott, Stacy Git thanks Nena     Previous Messages    ----- Message ----- From: Neysa Nena GORMAN, RN Sent: 04/16/2024   4:02 PM EDT To: Milling [name]; Glade Zott Subject: new cpap user osa severe                      New order in epic,  osa severe  Meghan Griffin Female, 43 y.o., 1980-11-29 MRN: [id number] Phone: [phone]  Thank you  Particia SAUNDERS

## 2024-04-29 ENCOUNTER — Ambulatory Visit (INDEPENDENT_AMBULATORY_CARE_PROVIDER_SITE_OTHER): Admitting: Family Medicine

## 2024-04-29 ENCOUNTER — Encounter (INDEPENDENT_AMBULATORY_CARE_PROVIDER_SITE_OTHER): Payer: Self-pay | Admitting: Family Medicine

## 2024-04-29 VITALS — BP 128/81 | HR 94 | Temp 98.4°F | Ht 62.0 in | Wt 319.0 lb

## 2024-04-29 DIAGNOSIS — E782 Mixed hyperlipidemia: Secondary | ICD-10-CM | POA: Diagnosis not present

## 2024-04-29 DIAGNOSIS — Z6841 Body Mass Index (BMI) 40.0 and over, adult: Secondary | ICD-10-CM

## 2024-04-29 DIAGNOSIS — R7303 Prediabetes: Secondary | ICD-10-CM

## 2024-04-29 NOTE — Assessment & Plan Note (Signed)

## 2024-04-29 NOTE — Progress Notes (Unsigned)
 SUBJECTIVE:  Chief Complaint: Obesity  Interim History: Patient really followed the diet on week 2 due to having just gone to the grocery store right before the last appointment.  She went shopping 4 days ago and feels that the plan has been holding her.  She hasn't started weighing out the meat yet. Next few weeks she does not have any anticipated events, activities or travel.  Wondering about alternative fruit options.   Meghan Griffin is here to discuss her progress with her obesity treatment plan. She is on the Category 3 Plan and states she is following her eating plan approximately 50 % of the time. She states she is resistance bands 15-20 minutes 4 times per week.   OBJECTIVE: Visit Diagnoses: Problem List Items Addressed This Visit       Other   Prediabetes - Primary   Pathophysiology of progression through insulin  resistance to prediabetes and diabetes was discussed at length today.  Patient to continue to monitor and be in control of total intake of snack calories which may be simple carbohydrates but should be consumed only after the patient has taken in all the nutrition for the day.  Macronutrient identification, classification and daily intake ratios were discussed.  Plan to repeat labs in 3 months to monitor both hemoglobin A1c and insulin  levels.  No medications at this time as patient is not having significant hunger or cravings that would make following meal plan more difficult.         Mixed hyperlipidemia   The 10-year ASCVD risk score (Arnett DK, et al., 2019) is: 1.7%   Values used to calculate the score:     Age: 43 years     Clincally relevant sex: Female     Is Non-Hispanic African American: Yes     Diabetic: No     Tobacco smoker: No     Systolic Blood Pressure: 128 mmHg     Is BP treated: No     HDL Cholesterol: 41 mg/dL     Total Cholesterol: 198 mg/dL   Patient is working on implementation of limiting saturated fat intake to less than 20% of her intake  daily.  Repeat labs in 3-4 months.      Other Visit Diagnoses       Morbid obesity (HCC), STARTING BMI 59.1         BMI 50.0-59.9, adult (HCC)           Vitals Temp: 98.4 F (36.9 C) BP: 128/81 Pulse Rate: 94 SpO2: 100 %   Anthropometric Measurements Height: 5' 2 (1.575 m) Weight: (!) 319 lb (144.7 kg) BMI (Calculated): 58.33 Weight at Last Visit: 321 lb Weight Lost Since Last Visit: 2 Weight Gained Since Last Visit: 0 Starting Weight: 321 lb Total Weight Loss (lbs): 2 lb (0.907 kg) Peak Weight: 345 lb   Body Composition  Body Fat %: 56.2 % Fat Mass (lbs): 179.6 lbs Muscle Mass (lbs): 133 lbs Total Body Water (lbs): 107.8 lbs Visceral Fat Rating : 23   Other Clinical Data Today's Visit #: 2 Starting Date: 04/15/24 Comments: Cat 3     ASSESSMENT AND PLAN:  Diet: Hannalee is currently in the action stage of change. As such, her goal is to continue with weight loss efforts and has agreed to the Category 3 Plan.   Exercise:  All adults should avoid inactivity. Some activity is better than none, and adults who participate in any amount of physical activity, gain some health benefits.  Behavior  Modification:  We discussed the following Behavioral Modification Strategies today: increasing lean protein intake, decreasing simple carbohydrates, increasing vegetables, meal planning and cooking strategies, and planning for success.   Return in about 3 weeks (around 05/20/2024).   She was informed of the importance of frequent follow up visits to maximize her success with intensive lifestyle modifications for her multiple health conditions.  Attestation Statements:   Reviewed by clinician on day of visit: allergies, medications, problem list, medical history, surgical history, family history, social history, and previous encounter notes.    Adelita Cho, MD

## 2024-04-29 NOTE — Assessment & Plan Note (Signed)
 The 10-year ASCVD risk score (Arnett DK, et al., 2019) is: 1.7%   Values used to calculate the score:     Age: 43 years     Clincally relevant sex: Female     Is Non-Hispanic African American: Yes     Diabetic: No     Tobacco smoker: No     Systolic Blood Pressure: 128 mmHg     Is BP treated: No     HDL Cholesterol: 41 mg/dL     Total Cholesterol: 198 mg/dL   Patient is working on implementation of limiting saturated fat intake to less than 20% of her intake daily.  Repeat labs in 3-4 months.

## 2024-04-30 ENCOUNTER — Telehealth: Payer: Self-pay | Admitting: Family Medicine

## 2024-04-30 ENCOUNTER — Other Ambulatory Visit (HOSPITAL_COMMUNITY): Payer: Self-pay

## 2024-04-30 ENCOUNTER — Other Ambulatory Visit: Payer: Self-pay | Admitting: Nurse Practitioner

## 2024-04-30 DIAGNOSIS — Z87892 Personal history of anaphylaxis: Secondary | ICD-10-CM

## 2024-04-30 MED ORDER — EPINEPHRINE 0.3 MG/0.3ML IJ SOAJ
0.3000 mg | INTRAMUSCULAR | 2 refills | Status: AC | PRN
  Filled 2024-04-30: qty 2, 1d supply, fill #0

## 2024-04-30 MED ORDER — FLUCONAZOLE 150 MG PO TABS
150.0000 mg | ORAL_TABLET | Freq: Every day | ORAL | 0 refills | Status: DC
  Filled 2024-04-30: qty 2, 3d supply, fill #0

## 2024-04-30 NOTE — Telephone Encounter (Signed)
 MY[facility] cancellation, NP out

## 2024-05-01 ENCOUNTER — Other Ambulatory Visit (HOSPITAL_COMMUNITY): Payer: Self-pay

## 2024-05-15 ENCOUNTER — Other Ambulatory Visit: Payer: Self-pay

## 2024-05-15 ENCOUNTER — Other Ambulatory Visit: Payer: Self-pay | Admitting: Nurse Practitioner

## 2024-05-18 ENCOUNTER — Other Ambulatory Visit (HOSPITAL_COMMUNITY): Payer: Self-pay

## 2024-05-18 MED ORDER — ESCITALOPRAM OXALATE 10 MG PO TABS
10.0000 mg | ORAL_TABLET | Freq: Every day | ORAL | 1 refills | Status: DC
  Filled 2024-05-18: qty 90, 90d supply, fill #0

## 2024-05-21 ENCOUNTER — Ambulatory Visit: Admitting: Nurse Practitioner

## 2024-05-27 ENCOUNTER — Encounter (INDEPENDENT_AMBULATORY_CARE_PROVIDER_SITE_OTHER): Payer: Self-pay

## 2024-05-27 ENCOUNTER — Encounter (INDEPENDENT_AMBULATORY_CARE_PROVIDER_SITE_OTHER): Admitting: Family Medicine

## 2024-05-27 DIAGNOSIS — E669 Obesity, unspecified: Secondary | ICD-10-CM

## 2024-06-07 NOTE — Progress Notes (Signed)
 Appt canceled

## 2024-06-08 ENCOUNTER — Other Ambulatory Visit (HOSPITAL_COMMUNITY): Payer: Self-pay

## 2024-06-09 ENCOUNTER — Encounter (INDEPENDENT_AMBULATORY_CARE_PROVIDER_SITE_OTHER): Payer: Self-pay | Admitting: Family Medicine

## 2024-06-09 ENCOUNTER — Telehealth (INDEPENDENT_AMBULATORY_CARE_PROVIDER_SITE_OTHER): Admitting: Family Medicine

## 2024-06-09 VITALS — Ht 62.0 in

## 2024-06-09 DIAGNOSIS — Z6841 Body Mass Index (BMI) 40.0 and over, adult: Secondary | ICD-10-CM

## 2024-06-09 DIAGNOSIS — R7303 Prediabetes: Secondary | ICD-10-CM

## 2024-06-09 NOTE — Progress Notes (Signed)
 TeleHealth Visit:   Meghan Griffin has verbally consented to this TeleHealth visit. The patient is located at home, the provider is located at the [facility] office. The participants in this visit include the listed provider and patient. The visit was conducted today via MyChart.    Chief Complaint: OBESITY Meghan Griffin is here to discuss her progress with her obesity treatment plan along with follow-up of her obesity related diagnoses. Meghan Griffin is on the Category 3 Plan and states she is following her eating plan approximately 75% of the time. Meghan Griffin states she is stretching with resistance band 15 minutes 7 times per week.  No data recorded Anthropometric Measurements Height: 5' 2 (1.575 m) Weight at Last Visit: 319 lb Starting Weight: 321 lb   No data recorded Other Clinical Data Today's Visit #: 4 Starting Date: 04/15/24 Comments: Cat 3- virtual    Reported Weight:320 lb  Subjective:  Has been trying hard to stick to the meal plan- she is incorporating resistance band activities.  She got her CPAP a week ago and is already noticing she isn't waking up with a headache.   She is starting to get tired of the food options.  Her daughter is going back to school in 5 days.  She may be interested in different food options. There are no diagnoses linked to this encounter. Assessment/Plan:   Assessment & Plan Prediabetes Patient is working on limiting simple carbohydrates and increasing her total protein intake.  She is interested in further pursuing learning about the nutrition of the food she is taking in.  Will need close follow up to dietary log to ensure macro ratios are at goal Morbid obesity (HCC), STARTING BMI 59.1  BMI 50.0-59.9, adult (HCC)    There are no diagnoses linked to this encounter.  Meghan Griffin is currently in the action stage of change. As such, her goal is to continue with weight loss efforts and has agreed to keeping a food journal and adhering to  recommended goals of 1450-1550 calories and 95 or more grams of protein.   Exercise goals: All adults should avoid inactivity. Some physical activity is better than none, and adults who participate in any amount of physical activity gain some health benefits.  Behavioral modification strategies: increasing lean protein intake, decreasing simple carbohydrates, increasing vegetables, meal planning and cooking strategies, keeping healthy foods in the home, planning for success, and keeping a strict food journal.  Meghan Griffin has agreed to follow-up with our clinic in 3 weeks. She was informed of the importance of frequent follow-up visits to maximize her success with intensive lifestyle modifications for her multiple health conditions.   Objective:   VITALS: Per patient if applicable, see vitals. GENERAL: Alert and in no acute distress. CARDIOPULMONARY: No increased WOB. Speaking in clear sentences.  PSYCH: Pleasant and cooperative. Speech normal rate and rhythm. Affect is appropriate. Insight and judgement are appropriate. Attention is focused, linear, and appropriate.  NEURO: Oriented as arrived to appointment on time with no prompting.   Lab Results  Component Value Date   CREATININE 0.75 04/15/2024   BUN 10 04/15/2024   NA 138 04/15/2024   K 4.7 04/15/2024   CL 100 04/15/2024   CO2 20 04/15/2024   Lab Results  Component Value Date   ALT 15 04/15/2024   AST 14 04/15/2024   ALKPHOS 101 04/15/2024   BILITOT 0.7 04/15/2024   Lab Results  Component Value Date   HGBA1C 5.8 (H) 04/15/2024   HGBA1C 5.9 (H)  06/25/2023   HGBA1C 5.9 (H) 12/26/2022   HGBA1C 5.7 12/25/2013   Lab Results  Component Value Date   INSULIN  17.8 04/15/2024   INSULIN  17.9 11/12/2023   Lab Results  Component Value Date   TSH 2.350 04/15/2024   Lab Results  Component Value Date   CHOL 198 04/15/2024   HDL 41 04/15/2024   LDLCALC 140 (H) 04/15/2024   TRIG 94 04/15/2024   CHOLHDL 4.8 (H) 04/15/2024    Lab Results  Component Value Date   VD25OH 72.8 04/15/2024   VD25OH 83.9 06/25/2023   Lab Results  Component Value Date   WBC 6.9 04/15/2024   HGB 12.9 04/15/2024   HCT 40.5 04/15/2024   MCV 94 04/15/2024   PLT 263 04/15/2024   No results found for: IRON, TIBC, FERRITIN  Attestation Statements:   Reviewed by clinician on day of visit: allergies, medications, problem list, medical history, surgical history, family history, social history, and previous encounter notes.    Meghan Cho, MD

## 2024-06-09 NOTE — Assessment & Plan Note (Signed)
 Patient is working on limiting simple carbohydrates and increasing her total protein intake.  She is interested in further pursuing learning about the nutrition of the food she is taking in.  Will need close follow up to dietary log to ensure macro ratios are at goal

## 2024-06-10 ENCOUNTER — Ambulatory Visit: Admitting: Nurse Practitioner

## 2024-06-10 ENCOUNTER — Encounter: Payer: Self-pay | Admitting: Nurse Practitioner

## 2024-06-10 ENCOUNTER — Other Ambulatory Visit (HOSPITAL_COMMUNITY): Payer: Self-pay

## 2024-06-10 VITALS — BP 120/70 | HR 91 | Temp 98.8°F | Ht 62.0 in | Wt 329.8 lb

## 2024-06-10 DIAGNOSIS — E66813 Obesity, class 3: Secondary | ICD-10-CM

## 2024-06-10 DIAGNOSIS — F50819 Binge eating disorder, unspecified: Secondary | ICD-10-CM | POA: Diagnosis not present

## 2024-06-10 DIAGNOSIS — Z6841 Body Mass Index (BMI) 40.0 and over, adult: Secondary | ICD-10-CM | POA: Diagnosis not present

## 2024-06-10 MED ORDER — LISDEXAMFETAMINE DIMESYLATE 60 MG PO CAPS
60.0000 mg | ORAL_CAPSULE | ORAL | 0 refills | Status: DC
  Filled 2024-08-17: qty 30, 30d supply, fill #0

## 2024-06-10 MED ORDER — LISDEXAMFETAMINE DIMESYLATE 60 MG PO CAPS
60.0000 mg | ORAL_CAPSULE | ORAL | 0 refills | Status: DC
  Filled 2024-06-10: qty 30, 30d supply, fill #0

## 2024-06-10 MED ORDER — LISDEXAMFETAMINE DIMESYLATE 60 MG PO CAPS
60.0000 mg | ORAL_CAPSULE | ORAL | 0 refills | Status: DC
  Filled 2024-07-13: qty 30, 30d supply, fill #0

## 2024-06-10 NOTE — Progress Notes (Signed)
 LILLETTE Kristeen JINNY Gladis, CMA,acting as a Neurosurgeon for [name], FNP.,have documented all relevant documentation on the behalf of [name], FNP,as directed by  [name], FNP while in the presence of [name], FNP.  Subjective:  Patient ID: Meghan Griffin , female    DOB: May 08, 1981 , 43 y.o.   MRN: [id number]  Chief Complaint  Patient presents with   Obesity    Patient presents today for a weight follow up, Patient reports compliance with medication. Patient denies any chest pain, SOB, or headaches. Patient has no concerns today.     HPI  Discussed the use of AI scribe software for clinical note transcription with the patient, who gave verbal consent to proceed.  History of Present Illness Meghan Griffin is a 43 year old female who presents for a weight check related to binge eating.  She has experienced a weight increase from 321 pounds in June to 329 pounds currently. She is on daily medication, which she believes aids her focus during workouts. Despite following a meal plan from [facility], she experiences cravings for other foods and is awaiting a revised plan to address these cravings.   She engages in daily resistance band exercises for 15 minutes but experiences knee pain, particularly after extensive walking, such as when accompanying her niece to an open house. She anticipates the arrival of a new exercise machine, a squat machine, which she has tried before. She has not been doing cardio exercises but plans to start once the machine arrives.  Her water intake is substantial, consuming two to two and a half liters daily, sometimes more. She acknowledges emotional eating, especially when bored, and attempts to manage it by chewing gum and opting for drinks instead of snacks. She has not discussed emotional eating in counseling.  She has a history of being on medication for binge eating, previously prescribed by [facility] two years ago. Her A1c was last checked  in June and was 5.8. No history of ADD or ADHD diagnosis, although medication helps her focus.    Past Medical History:  Diagnosis Date   Abnormal glucose level    ADD (attention deficit disorder)    ADHD    Binge eating disorder    Concentration deficit    Depression    Eczema    Eczema    Heartburn    Hemorrhoids    High cholesterol    History of gonorrhea 10/22/2004   treated   Multiple food allergies    Obesity    Prediabetes    Sleep apnea    Snoring    SOBOE (shortness of breath on exertion)      Family History  Problem Relation Age of Onset   Obesity Mother    Obesity Father    Alcohol abuse Father    COPD Father    Hyperlipidemia Father    CAD Father 25       MI   Stroke Father 40   Hypertension Father    Diabetes Father    Heart disease Father    Kidney disease Father    Sleep apnea Father    Alcoholism Father    Hyperlipidemia Maternal Grandmother    Hypertension Maternal Grandmother    Hyperlipidemia Maternal Grandfather    Hypertension Maternal Grandfather    Cancer Paternal Aunt        leukemia   Cancer Other        great grandmother - bone marrow  Current Outpatient Medications:    B Complex-C (SUPER B COMPLEX PO), Take by mouth., Disp: , Rfl:    Emollient (PALMERS COCOA BUTTER FORMULA) CREA, Apply topically as directed., Disp: 434 g, Rfl: 0   EPINEPHrine  (EPIPEN  2-PAK) 0.3 mg/0.3 mL IJ SOAJ injection, Inject 0.3 mg into the muscle as needed for anaphylaxis., Disp: 2 each, Rfl: 2   escitalopram  (LEXAPRO ) 10 MG tablet, Take 1 tablet (10 mg total) by mouth daily., Disp: 90 tablet, Rfl: 1   levonorgestrel  (MIRENA , 52 MG,) 20 MCG/DAY IUD, provided by care center, Disp: , Rfl:    Magnesium 250 MG TABS, Take by mouth., Disp: , Rfl:    menthol -zinc  oxide (GOLD  BOND) powder, Apply topically 2 (two) times daily., Disp: 113 g, Rfl: 0   mometasone  (ELOCON ) 0.1 % cream, Apply 1 Application topically daily., Disp: 45 g, Rfl: 0   nystatin   (MYCOSTATIN /NYSTOP ) powder, Apply 1 Application topically 3 (three) times daily., Disp: 15 g, Rfl: 0   rosuvastatin  (CRESTOR ) 10 MG tablet, Take 1 tablet (10 mg total) by mouth at bedtime., Disp: 90 tablet, Rfl: 1   Vitamin D , Ergocalciferol , (DRISDOL) 1.25 MG (50000 UNIT) CAPS capsule, Take 50,000 Units by mouth every 7 (seven) days., Disp: , Rfl:    Vitamins-Lipotropics (COMPLEX B-100-INOSITOL) TBCR, Take by mouth., Disp: , Rfl:    escitalopram  (LEXAPRO ) 10 MG tablet, Take 1 tablet (10 mg total) by mouth daily., Disp: 95 tablet, Rfl: 5   lisdexamfetamine (VYVANSE ) 60 MG capsule, Take 1 capsule (60 mg total) by mouth every morning., Disp: 30 capsule, Rfl: 0   [START ON 07/10/2024] lisdexamfetamine (VYVANSE ) 60 MG capsule, Take 1 capsule (60 mg total) by mouth every morning., Disp: 30 capsule, Rfl: 0   [START ON 08/09/2024] lisdexamfetamine (VYVANSE ) 60 MG capsule, Take 1 capsule (60 mg total) by mouth every morning., Disp: 30 capsule, Rfl: 0   Allergies  Allergen Reactions   Amoxicillin Diarrhea    Has patient had a PCN reaction causing immediate rash, facial/tongue/throat swelling, SOB or lightheadedness with hypotension: no Has patient had a PCN reaction causing severe rash involving mucus membranes or skin necrosis: no Has patient had a PCN reaction that required hospitalization: no Has patient had a PCN reaction occurring within the last 10 years: yes If all of the above answers are NO, then may proceed with Cephalosporin use.    Justicia Adhatoda Anaphylaxis   Other Anaphylaxis and Swelling    Tree nuts   Shellfish-Derived Products Anaphylaxis    GI upset-lobster and crab     Review of Systems  Constitutional: Negative.   Respiratory: Negative.    Cardiovascular: Negative.   Musculoskeletal: Negative.   Neurological: Negative.   Psychiatric/Behavioral: Negative.       Today's Vitals   06/10/24 1152  BP: 120/70  Pulse: 91  Temp: 98.8 F (37.1 C)  TempSrc: Oral  Weight:  (!) 329 lb 12.8 oz (149.6 kg)  Height: 5' 2 (1.575 m)  PainSc: 3   PainLoc: Knee   Body mass index is 60.32 kg/m.  Wt Readings from Last 3 Encounters:  06/10/24 (!) 329 lb 12.8 oz (149.6 kg)  04/29/24 (!) 319 lb (144.7 kg)  04/15/24 (!) 321 lb (145.6 kg)     Objective:  Physical Exam Vitals and nursing note reviewed.  Constitutional:      General: She is not in acute distress.    Appearance: Normal appearance. She is well-developed. She is obese.  Cardiovascular:     Rate and Rhythm: Normal  rate and regular rhythm.     Pulses: Normal pulses.     Heart sounds: Normal heart sounds. No murmur heard. Pulmonary:     Effort: Pulmonary effort is normal. No respiratory distress.     Breath sounds: Normal breath sounds. No wheezing.  Chest:     Chest wall: No tenderness.  Musculoskeletal:        General: Normal range of motion.  Skin:    General: Skin is warm and dry.     Capillary Refill: Capillary refill takes less than 2 seconds.  Neurological:     General: No focal deficit present.     Mental Status: She is alert and oriented to person, place, and time.     Cranial Nerves: No cranial nerve deficit.     Coordination: Coordination normal.  Psychiatric:        Mood and Affect: Mood normal.        Behavior: Behavior normal.        Thought Content: Thought content normal.        Judgment: Judgment normal.      Assessment And Plan:  Binge eating disorder, unspecified severity Assessment & Plan: Binge eating disorder with emotional eating. No current counseling. Vyvanse  aids focus, may address ADD/ADHD symptoms. - Increase Vyvanse  to 60 mg daily. - Refer to counseling for emotional eating. - Administer questionnaire to assess response to Vyvanse .  Orders: -     Lisdexamfetamine Dimesylate ; Take 1 capsule (60 mg total) by mouth every morning.  Dispense: 30 capsule; Refill: 0 -     Lisdexamfetamine Dimesylate ; Take 1 capsule (60 mg total) by mouth every morning.  Dispense:  30 capsule; Refill: 0 -     Lisdexamfetamine Dimesylate ; Take 1 capsule (60 mg total) by mouth every morning.  Dispense: 30 capsule; Refill: 0  Class 3 severe obesity with body mass index (BMI) of 60.0 to 69.9 in adult, unspecified obesity type, unspecified whether serious comorbidity present Assessment & Plan: Following meal plan and exercises. Reports knee pain after walking. - Continue meal plan from [facility]. - Encourage use of new exercise machine for total body workouts. - Recommend water aerobics to reduce knee strain. - Schedule follow-up in 3 months.    Return for 3 month f/u prediabetes.  Patient was given opportunity to ask questions. Patient verbalized understanding of the plan and was able to repeat key elements of the plan. All questions were answered to their satisfaction.   LILLETTE [name], FNP, have reviewed all documentation for this visit. The documentation on 06/10/24 for the exam, diagnosis, procedures, and orders are all accurate and complete.   IF YOU HAVE BEEN REFERRED TO A SPECIALIST, IT MAY TAKE 1-2 WEEKS TO SCHEDULE/PROCESS THE REFERRAL. IF YOU HAVE NOT HEARD FROM US /SPECIALIST IN TWO WEEKS, PLEASE GIVE US  A CALL AT [phone].

## 2024-06-11 ENCOUNTER — Encounter (INDEPENDENT_AMBULATORY_CARE_PROVIDER_SITE_OTHER): Payer: Self-pay | Admitting: Family Medicine

## 2024-06-11 NOTE — Telephone Encounter (Signed)
**Note De-identified  [place] Obfuscation** Please advise 

## 2024-06-18 ENCOUNTER — Other Ambulatory Visit (HOSPITAL_COMMUNITY): Payer: Self-pay

## 2024-06-18 DIAGNOSIS — Z01419 Encounter for gynecological examination (general) (routine) without abnormal findings: Secondary | ICD-10-CM | POA: Diagnosis not present

## 2024-06-18 DIAGNOSIS — Z113 Encounter for screening for infections with a predominantly sexual mode of transmission: Secondary | ICD-10-CM | POA: Diagnosis not present

## 2024-06-18 DIAGNOSIS — Z1231 Encounter for screening mammogram for malignant neoplasm of breast: Secondary | ICD-10-CM | POA: Diagnosis not present

## 2024-06-18 MED ORDER — ESCITALOPRAM OXALATE 10 MG PO TABS
10.0000 mg | ORAL_TABLET | Freq: Every day | ORAL | 5 refills | Status: AC
  Filled 2024-11-09: qty 90, 90d supply, fill #0

## 2024-06-19 NOTE — Assessment & Plan Note (Signed)
 Following meal plan and exercises. Reports knee pain after walking. - Continue meal plan from [facility]. - Encourage use of new exercise machine for total body workouts. - Recommend water aerobics to reduce knee strain. - Schedule follow-up in 3 months.

## 2024-06-19 NOTE — Assessment & Plan Note (Signed)
 Binge eating disorder with emotional eating. No current counseling. Vyvanse  aids focus, may address ADD/ADHD symptoms. - Increase Vyvanse  to 60 mg daily. - Refer to counseling for emotional eating. - Administer questionnaire to assess response to Vyvanse .

## 2024-07-07 ENCOUNTER — Other Ambulatory Visit (HOSPITAL_BASED_OUTPATIENT_CLINIC_OR_DEPARTMENT_OTHER): Payer: Self-pay

## 2024-07-07 MED ORDER — FLUZONE 0.5 ML IM SUSY
0.5000 mL | PREFILLED_SYRINGE | Freq: Once | INTRAMUSCULAR | 0 refills | Status: AC
  Filled 2024-07-07: qty 0.5, 1d supply, fill #0

## 2024-07-09 ENCOUNTER — Ambulatory Visit: Admitting: Family Medicine

## 2024-07-09 ENCOUNTER — Ambulatory Visit (INDEPENDENT_AMBULATORY_CARE_PROVIDER_SITE_OTHER): Admitting: Family Medicine

## 2024-07-09 VITALS — BP 137/83 | HR 91 | Temp 97.9°F | Ht 62.0 in | Wt 317.0 lb

## 2024-07-09 DIAGNOSIS — R7303 Prediabetes: Secondary | ICD-10-CM | POA: Diagnosis not present

## 2024-07-09 DIAGNOSIS — E782 Mixed hyperlipidemia: Secondary | ICD-10-CM | POA: Diagnosis not present

## 2024-07-09 DIAGNOSIS — Z6841 Body Mass Index (BMI) 40.0 and over, adult: Secondary | ICD-10-CM

## 2024-07-09 NOTE — Progress Notes (Signed)
 SUBJECTIVE:  Chief Complaint: Obesity  Interim History: Patient has been writing down foods she is eating.  She is trying to stay active with her resistance bands. Meal plan wise she is still enjoying eggs for breakfast with toast and I can't believe it's not butter.  She may do honey and stevia.  Still eating yogurt throughout the day. Still eating fruits and vegetables and packing lunch for office. She does feel she is getting the quantity of food in at each meal.  She voices there is room for improvement in terms of figuring how much she can eat.    Meghan Griffin is here to discuss her progress with her obesity treatment plan. She is on the Category 3 Plan and states she is following her eating plan approximately 75 % of the time. She states she is stretching 15 minutes 5 times per week.   OBJECTIVE: Visit Diagnoses: Problem List Items Addressed This Visit       Other   Prediabetes - Primary   Mixed hyperlipidemia   Other Visit Diagnoses       Morbid obesity (HCC), STARTING BMI 59.1         BMI 50.0-59.9, adult (HCC)           Vitals Temp: 97.9 F (36.6 C) BP: 137/83 Pulse Rate: 91 SpO2: 100 %   Anthropometric Measurements Height: 5' 2 (1.575 m) Weight: (!) 317 lb (143.8 kg) BMI (Calculated): 57.97 Weight at Last Visit: 319 lb Weight Lost Since Last Visit: 2 Starting Weight: 321 lb Total Weight Loss (lbs): 4 lb (1.814 kg)   Body Composition  Body Fat %: 55.7 % Fat Mass (lbs): 177 lbs Muscle Mass (lbs): 133.8 lbs Total Body Water (lbs): 102.6 lbs Visceral Fat Rating : 23   Other Clinical Data Today's Visit #: 5 Starting Date: 04/15/24 Comments: Cat 3     ASSESSMENT AND PLAN: Assessment & Plan Prediabetes Patient has been trying to focus on dietary intake of protein and vegetables.  We discussed once again carbohydrates and classifications of food that fall under carbohydrates.  We discussed the importance of not just monitoring calories but the make up  of those calories with a focus on protein to help limit her simple carbohydrate.  Will follow-up on repeat labs at next appointment. Mixed hyperlipidemia Initial blood work showing elevation of LDL at 140.  Patient has been working on dietary intake and lifestyle modifications to help manage her hypercholesterolemia.  Will need repeat labs in the next 1 to 2 months. Morbid obesity (HCC), STARTING BMI 59.1  BMI 50.0-59.9, adult (HCC)    Diet: Meghan Griffin is currently in the action stage of change. As such, her goal is to continue with weight loss efforts and has agreed to the Category 3 Plan.   Exercise:  For substantial health benefits, adults should do at least 150 minutes (2 hours and 30 minutes) a week of moderate-intensity, or 75 minutes (1 hour and 15 minutes) a week of vigorous-intensity aerobic physical activity, or an equivalent combination of moderate- and vigorous-intensity aerobic activity. Aerobic activity should be performed in episodes of at least 10 minutes, and preferably, it should be spread throughout the week.  Behavior Modification:  We discussed the following Behavioral Modification Strategies today: increasing lean protein intake, decreasing simple carbohydrates, increasing vegetables, meal planning and cooking strategies, and planning for success.   Return in about 4 weeks (around 08/06/2024).   She was informed of the importance of frequent follow up visits to maximize  her success with intensive lifestyle modifications for her multiple health conditions.  Attestation Statements:   Reviewed by clinician on day of visit: allergies, medications, problem list, medical history, surgical history, family history, social history, and previous encounter notes.     Adelita Cho, MD

## 2024-07-13 ENCOUNTER — Other Ambulatory Visit (HOSPITAL_COMMUNITY): Payer: Self-pay

## 2024-07-18 NOTE — Assessment & Plan Note (Signed)
 Initial blood work showing elevation of LDL at 140.  Patient has been working on dietary intake and lifestyle modifications to help manage her hypercholesterolemia.  Will need repeat labs in the next 1 to 2 months.

## 2024-07-18 NOTE — Assessment & Plan Note (Signed)
 Patient has been trying to focus on dietary intake of protein and vegetables.  We discussed once again carbohydrates and classifications of food that fall under carbohydrates.  We discussed the importance of not just monitoring calories but the make up of those calories with a focus on protein to help limit her simple carbohydrate.  Will follow-up on repeat labs at next appointment.

## 2024-08-13 ENCOUNTER — Ambulatory Visit (INDEPENDENT_AMBULATORY_CARE_PROVIDER_SITE_OTHER): Admitting: Family Medicine

## 2024-08-13 ENCOUNTER — Encounter (INDEPENDENT_AMBULATORY_CARE_PROVIDER_SITE_OTHER): Payer: Self-pay | Admitting: Family Medicine

## 2024-08-13 VITALS — BP 138/79 | HR 95 | Temp 98.3°F | Ht 62.0 in | Wt 321.0 lb

## 2024-08-13 DIAGNOSIS — E782 Mixed hyperlipidemia: Secondary | ICD-10-CM

## 2024-08-13 DIAGNOSIS — Z6841 Body Mass Index (BMI) 40.0 and over, adult: Secondary | ICD-10-CM

## 2024-08-13 DIAGNOSIS — G4733 Obstructive sleep apnea (adult) (pediatric): Secondary | ICD-10-CM | POA: Diagnosis not present

## 2024-08-13 NOTE — Assessment & Plan Note (Addendum)
 Patient is using her CPAP but not consistently as she does occasionally fall asleep without it on.  She is wearing CPAP around 5x a week.  Sleep quality is good when she is wearing it.  Increased compliance discussed.  Follow up at next appointment to discuss if patient has been more consistent in usage.

## 2024-08-13 NOTE — Progress Notes (Signed)
 SUBJECTIVE:  Chief Complaint: Obesity  Interim History: Patient has been living her normal life- taking care of her daughter and working.  Breakfast and lunch have been consistent of eggs in the am and possibly with salmon or tuna.  She will have toast with I Can't Believe its Not butter on apple pie spice and cinnamon and sugar.  She will eat crackers as a snack.  For lunch she will eat a salad or a sandwich and if she goes to the office she will have the [employer] Protein. At nighttime she will have popcorn or biscoff cookies. Planning to bring her daughter to a fall festival at her church.  She is likely going to her grandmother's for Thanksgiving.    Meghan Griffin is here to discuss her progress with her obesity treatment plan. She is on the Category 3 Plan and states she is following her eating plan approximately 75 % of the time. She states she is exercising 15-20 minutes 7 times per week.   OBJECTIVE: Visit Diagnoses: Problem List Items Addressed This Visit       Respiratory   OSA (obstructive sleep apnea) - Primary   Patient is using her CPAP but not consistently as she does occasionally fall asleep without it on.  She is wearing CPAP around 5x a week.  Sleep quality is good when she is wearing it.          Other   Mixed hyperlipidemia   Other Visit Diagnoses       Morbid obesity (HCC), STARTING BMI 59.1         BMI 50.0-59.9, adult (HCC)           Vitals Temp: 98.3 F (36.8 C) BP: 138/79 Pulse Rate: 95 SpO2: 100 %   Anthropometric Measurements Height: 5' 2 (1.575 m) Weight: (!) 321 lb (145.6 kg) BMI (Calculated): 58.7 Weight at Last Visit: 317 lb Weight Lost Since Last Visit: 0 Weight Gained Since Last Visit: 4 Starting Weight: 321 lb Total Weight Loss (lbs): 0 lb (0 kg)   Body Composition  Body Fat %: 57.7 % Fat Mass (lbs): 185.2 lbs Muscle Mass (lbs): 129 lbs Visceral Fat Rating : 24   Other Clinical Data Today's Visit #: 6 Starting Date:  04/15/24 Comments: Cat 3     ASSESSMENT AND PLAN: Assessment & Plan OSA (obstructive sleep apnea) Patient is using her CPAP but not consistently as she does occasionally fall asleep without it on.  She is wearing CPAP around 5x a week.  Sleep quality is good when she is wearing it.  Increased compliance discussed.  Follow up at next appointment to discuss if patient has been more consistent in usage. Mixed hyperlipidemia Last LDL not at goal.  LDL at 140.  Needs repeat labs done next month with PCP or here.  Continue lifestyle modifications and dietary changes. Morbid obesity (HCC), STARTING BMI 59.1  BMI 50.0-59.9, adult (HCC)    Diet: Alvina is currently in the action stage of change. As such, her goal is to continue with weight loss efforts and has agreed to the Category 3 Plan.  Patient to increase quantity of food she is eating at lunch to make up for lack of supper.  She will add additional protein at lunch.  She will eat something as a consistent snack in the evening to avoid prolonged fasting.  Exercise:  For substantial health benefits, adults should do at least 150 minutes (2 hours and 30 minutes) a week of moderate-intensity,  or 75 minutes (1 hour and 15 minutes) a week of vigorous-intensity aerobic physical activity, or an equivalent combination of moderate- and vigorous-intensity aerobic activity. Aerobic activity should be performed in episodes of at least 10 minutes, and preferably, it should be spread throughout the week.  Behavior Modification:  We discussed the following Behavioral Modification Strategies today: increasing lean protein intake, decreasing simple carbohydrates, increasing vegetables, meal planning and cooking strategies, and planning for success.   Return in about 3 weeks (around 09/03/2024) for fasting labs.   She was informed of the importance of frequent follow up visits to maximize her success with intensive lifestyle modifications for her multiple  health conditions.  Attestation Statements:   Reviewed by clinician on day of visit: allergies, medications, problem list, medical history, surgical history, family history, social history, and previous encounter notes.     Adelita Cho, MD

## 2024-08-17 ENCOUNTER — Other Ambulatory Visit (HOSPITAL_COMMUNITY): Payer: Self-pay

## 2024-08-21 NOTE — Assessment & Plan Note (Signed)
 Last LDL not at goal.  LDL at 140.  Needs repeat labs done next month with PCP or here.  Continue lifestyle modifications and dietary changes.

## 2024-09-09 ENCOUNTER — Encounter (INDEPENDENT_AMBULATORY_CARE_PROVIDER_SITE_OTHER): Payer: Self-pay | Admitting: Family Medicine

## 2024-09-09 ENCOUNTER — Ambulatory Visit (INDEPENDENT_AMBULATORY_CARE_PROVIDER_SITE_OTHER): Payer: Self-pay | Admitting: Family Medicine

## 2024-09-09 VITALS — BP 129/76 | HR 79 | Temp 98.0°F | Ht 62.0 in | Wt 320.0 lb

## 2024-09-09 DIAGNOSIS — E782 Mixed hyperlipidemia: Secondary | ICD-10-CM

## 2024-09-09 DIAGNOSIS — F50814 Binge eating disorder, in remission: Secondary | ICD-10-CM | POA: Diagnosis not present

## 2024-09-09 DIAGNOSIS — Z6841 Body Mass Index (BMI) 40.0 and over, adult: Secondary | ICD-10-CM

## 2024-09-09 DIAGNOSIS — R7303 Prediabetes: Secondary | ICD-10-CM

## 2024-09-09 DIAGNOSIS — G4733 Obstructive sleep apnea (adult) (pediatric): Secondary | ICD-10-CM | POA: Diagnosis not present

## 2024-09-09 NOTE — Progress Notes (Signed)
 SUBJECTIVE:  Chief Complaint: Obesity  Interim History: Patient has been craving sweets significantly over the last month- donuts particularly.  She has tried to limit her consumption but it has been hard.  For Thanksgiving she is going to her grandmother's house and she is planning on eating turkey breast and collard greens. She has eaten off plan about 25-50% of the time. Compliance has been limited due to time and availability of food. Next three weeks patient is not having much to do and is anticipating being at home working on a science project with her daughter. Denies any changes having to be made over the next 3-4 weeks. Dinner she tends to be hungry and not hungry and that leads her to eat off plan.   Meghan Griffin is here to discuss her progress with her obesity treatment plan. She is on the Category 3 Plan and states she is following her eating plan approximately 50-75 % of the time. She states she is exercising with resistance bands 15 minutes 7 times per week.   OBJECTIVE: Visit Diagnoses: Problem List Items Addressed This Visit       Respiratory   OSA (obstructive sleep apnea) - Primary     Other   Prediabetes   Relevant Orders   Comprehensive metabolic panel with GFR (Completed)   Hemoglobin A1c (Completed)   Insulin , random (Completed)   Binge eating disorder   On Vyvanse  60mg  daily.  She is still having craving and urges to increase her sweets intake.  She denies any side effects.        Mixed hyperlipidemia   Relevant Orders   Lipid Panel With LDL/HDL Ratio (Completed)   Other Visit Diagnoses       Morbid obesity (HCC), STARTING BMI 59.1         BMI 50.0-59.9, adult (HCC)             09/10/2024   11:47 AM 09/09/2024    9:27 AM 09/09/2024    9:00 AM  Vitals with BMI  Height 5' 2  5' 2  Weight 328 lbs  320 lbs  BMI 59.98  58.51  Systolic 130 129 856  Diastolic 72 76 77  Pulse 96  79    No data recorded No data recorded  No data recorded No data  recorded    ASSESSMENT AND PLAN: Assessment & Plan OSA (obstructive sleep apnea) Patient is still wearing CPAP more frequently than not.  There is still room for improvement in terms of her compliance.  We discussed the importance of wearing CPAP nightly for both sleep quantity and quality.  Will follow-up at next appointment. Binge eating disorder in remission On Vyvanse  60mg  daily.  She is still having craving and urges to increase her sweets intake.  She denies any side effects.  Prescribed by PCP. BMI 50.0-59.9, adult [facility])  Prediabetes Patient is being mindful on controlling simple carbohydrate intake on meal plan and with assistance of Vyvanse .  Repeat CMP, A1c, insulin  level today. Mixed hyperlipidemia Prior fasting lipid panel done 5 months ago with elevated LDL and HDL below goal.  Will repeat fasting lipid panel today and plan to discuss labs at next appointment and risk stratify with new values. Morbid obesity (HCC), STARTING BMI 59.1    Diet: Meghan Griffin is currently in the action stage of change. As such, her goal is to continue with weight loss efforts and has agreed to the Category 3 Plan.   Exercise:  For substantial health benefits, adults  should do at least 150 minutes (2 hours and 30 minutes) a week of moderate-intensity, or 75 minutes (1 hour and 15 minutes) a week of vigorous-intensity aerobic physical activity, or an equivalent combination of moderate- and vigorous-intensity aerobic activity. Aerobic activity should be performed in episodes of at least 10 minutes, and preferably, it should be spread throughout the week.  Behavior Modification:  We discussed the following Behavioral Modification Strategies today: increasing lean protein intake, decreasing simple carbohydrates, increasing vegetables, and planning for success.   Follow-up in 4 weeks  She was informed of the importance of frequent follow up visits to maximize her success with intensive lifestyle  modifications for her multiple health conditions.  Attestation Statements:   Reviewed by clinician on day of visit: allergies, medications, problem list, medical history, surgical history, family history, social history, and previous encounter notes.     Adelita Cho, MD

## 2024-09-09 NOTE — Assessment & Plan Note (Addendum)
 On Vyvanse  60mg  daily.  She is still having craving and urges to increase her sweets intake.  She denies any side effects.  Prescribed by PCP.

## 2024-09-10 ENCOUNTER — Other Ambulatory Visit (HOSPITAL_COMMUNITY): Payer: Self-pay

## 2024-09-10 ENCOUNTER — Ambulatory Visit: Payer: Self-pay | Admitting: Nurse Practitioner

## 2024-09-10 VITALS — BP 130/72 | HR 96 | Temp 98.6°F | Ht 62.0 in | Wt 328.0 lb

## 2024-09-10 DIAGNOSIS — E66813 Obesity, class 3: Secondary | ICD-10-CM

## 2024-09-10 DIAGNOSIS — F50819 Binge eating disorder, unspecified: Secondary | ICD-10-CM

## 2024-09-10 DIAGNOSIS — Z2821 Immunization not carried out because of patient refusal: Secondary | ICD-10-CM | POA: Diagnosis not present

## 2024-09-10 DIAGNOSIS — Z6841 Body Mass Index (BMI) 40.0 and over, adult: Secondary | ICD-10-CM

## 2024-09-10 DIAGNOSIS — R41840 Attention and concentration deficit: Secondary | ICD-10-CM

## 2024-09-10 DIAGNOSIS — Z9989 Dependence on other enabling machines and devices: Secondary | ICD-10-CM | POA: Diagnosis not present

## 2024-09-10 DIAGNOSIS — G4733 Obstructive sleep apnea (adult) (pediatric): Secondary | ICD-10-CM | POA: Diagnosis not present

## 2024-09-10 LAB — COMPREHENSIVE METABOLIC PANEL WITH GFR
ALT: 16 IU/L (ref 0–32)
AST: 21 IU/L (ref 0–40)
Albumin: 4.2 g/dL (ref 3.9–4.9)
Alkaline Phosphatase: 95 IU/L (ref 41–116)
BUN/Creatinine Ratio: 15 (ref 9–23)
BUN: 12 mg/dL (ref 6–24)
Bilirubin Total: 0.6 mg/dL (ref 0.0–1.2)
CO2: 24 mmol/L (ref 20–29)
Calcium: 9.6 mg/dL (ref 8.7–10.2)
Chloride: 99 mmol/L (ref 96–106)
Creatinine, Ser: 0.78 mg/dL (ref 0.57–1.00)
Globulin, Total: 4.1 g/dL (ref 1.5–4.5)
Glucose: 81 mg/dL (ref 70–99)
Potassium: 4.3 mmol/L (ref 3.5–5.2)
Sodium: 137 mmol/L (ref 134–144)
Total Protein: 8.3 g/dL (ref 6.0–8.5)
eGFR: 96 mL/min/1.73 (ref >59)

## 2024-09-10 LAB — LIPID PANEL WITH LDL/HDL RATIO
Cholesterol, Total: 191 mg/dL (ref 100–199)
HDL: 48 mg/dL (ref >39)
LDL Chol Calc (NIH): 128 mg/dL — ABNORMAL HIGH (ref 0–99)
LDL/HDL Ratio: 2.7 ratio (ref 0.0–3.2)
Triglycerides: 82 mg/dL (ref 0–149)
VLDL Cholesterol Cal: 15 mg/dL (ref 5–40)

## 2024-09-10 LAB — HEMOGLOBIN A1C
Est. average glucose Bld gHb Est-mCnc: 120 mg/dL
Hgb A1c MFr Bld: 5.8 % — ABNORMAL HIGH (ref 4.8–5.6)

## 2024-09-10 LAB — INSULIN, RANDOM: INSULIN: 17.1 u[IU]/mL (ref 2.6–24.9)

## 2024-09-10 MED ORDER — LISDEXAMFETAMINE DIMESYLATE 60 MG PO CAPS
60.0000 mg | ORAL_CAPSULE | ORAL | 0 refills | Status: AC
  Filled 2024-09-10 – 2024-09-29 (×2): qty 30, 30d supply, fill #0

## 2024-09-10 MED ORDER — LISDEXAMFETAMINE DIMESYLATE 60 MG PO CAPS
60.0000 mg | ORAL_CAPSULE | ORAL | 0 refills | Status: AC
Start: 2024-11-09 — End: ?

## 2024-09-10 MED ORDER — LISDEXAMFETAMINE DIMESYLATE 60 MG PO CAPS
60.0000 mg | ORAL_CAPSULE | ORAL | 0 refills | Status: AC
  Filled 2024-11-09: qty 30, 30d supply, fill #0

## 2024-09-10 NOTE — Progress Notes (Signed)
 LILLETTE Kristeen JINNY Gladis, CMA,acting as a neurosurgeon for Meghan Ada, FNP.,have documented all relevant documentation on the behalf of Meghan Ada, FNP,as directed by  Meghan Ada, FNP while in the presence of Meghan Ada, FNP.  Subjective:  Patient ID: Meghan Griffin , female    DOB: May 21, 1981 , 43 y.o.   MRN: [id number]  Chief Complaint  Patient presents with   Obesity    Patient presents today for a weight follow up, Patient reports compliance with medication. Patient denies any chest pain, SOB, or headaches. Patient has no concerns today.     HPI  Discussed the use of AI scribe software for clinical note transcription with the patient, who gave verbal consent to proceed.  History of Present Illness Meghan Griffin is a 43 year old female who presents for medication management and weight concerns.  She experiences inconsistent adherence to her medication regimen, particularly on weekends when she wakes up late and forgets to take her medication. She is currently taking Lexapro , prescribed by her OB GYN, and Vyvanse  at a dose of 60 mg. Vyvanse  helps her stay focused and reduces her cravings for sweets, although she sometimes misses doses on weekends.  Her recent weight measurement was 321 pounds at her OB GYN's office, differing from the weight recorded at this office. She is attempting to follow a healthy weight management diet and has received a new meal plan for dinner. Despite these efforts, she has noticed an increased craving for sweets since the change in seasons. Her physical activity includes using resistance bands for about 15 minutes during the day, but she has not been engaging in cardio exercises. She finds it difficult to maintain an exercise routine, especially after work when it is dark outside.  She has a history of sleep apnea and uses a CPAP machine approximately four to five nights a week. She wakes up with headaches on mornings after not using the machine, although the headaches  tend to ease off later in the day.  Past Medical History:  Diagnosis Date   Abnormal glucose level    ADD (attention deficit disorder)    ADHD    Allergy 2004   Tree Nuts   Binge eating disorder    Concentration deficit    Depression    Eczema    Eczema    Heartburn    Hemorrhoids    High cholesterol    History of gonorrhea 10/22/2004   treated   Multiple food allergies    Obesity    Prediabetes    Sleep apnea    Snoring    SOBOE (shortness of breath on exertion)      Family History  Problem Relation Age of Onset   Obesity Mother    Obesity Father    Alcohol abuse Father    COPD Father    Hyperlipidemia Father    CAD Father 74       MI   Stroke Father 9   Hypertension Father    Diabetes Father    Heart disease Father    Kidney disease Father    Sleep apnea Father    Alcoholism Father    Arthritis Father    Hyperlipidemia Maternal Grandmother    Hypertension Maternal Grandmother    Hyperlipidemia Maternal Grandfather    Hypertension Maternal Grandfather    Cancer Paternal Aunt        leukemia   Cancer Other        great grandmother - bone marrow  Current Outpatient Medications:    B Complex-C (SUPER B COMPLEX PO), Take by mouth., Disp: , Rfl:    EPINEPHrine  (EPIPEN  2-PAK) 0.3 mg/0.3 mL IJ SOAJ injection, Inject 0.3 mg into the muscle as needed for anaphylaxis., Disp: 2 each, Rfl: 2   escitalopram  (LEXAPRO ) 10 MG tablet, Take 1 tablet (10 mg total) by mouth daily., Disp: 95 tablet, Rfl: 5   levonorgestrel  (MIRENA , 52 MG,) 20 MCG/DAY IUD, provided by care center, Disp: , Rfl:    Magnesium 250 MG TABS, Take by mouth., Disp: , Rfl:    mometasone  (ELOCON ) 0.1 % cream, Apply 1 Application topically daily., Disp: 45 g, Rfl: 0   nystatin  (MYCOSTATIN /NYSTOP ) powder, Apply 1 Application topically 3 (three) times daily., Disp: 15 g, Rfl: 0   rosuvastatin  (CRESTOR ) 10 MG tablet, Take 1 tablet (10 mg total) by mouth at bedtime., Disp: 90 tablet, Rfl: 1   Vitamin  D, Ergocalciferol , (DRISDOL) 1.25 MG (50000 UNIT) CAPS capsule, Take 50,000 Units by mouth every 7 (seven) days., Disp: , Rfl:    Vitamins-Lipotropics (COMPLEX B-100-INOSITOL) TBCR, Take by mouth., Disp: , Rfl:    lisdexamfetamine (VYVANSE ) 60 MG capsule, Take 1 capsule (60 mg total) by mouth every morning., Disp: 30 capsule, Rfl: 0   [START ON 10/10/2024] lisdexamfetamine (VYVANSE ) 60 MG capsule, Take 1 capsule (60 mg total) by mouth every morning. (10/10/24), Disp: 30 capsule, Rfl: 0   [START ON 11/09/2024] lisdexamfetamine (VYVANSE ) 60 MG capsule, Take 1 capsule (60 mg total) by mouth every morning. (11/09/24), Disp: 30 capsule, Rfl: 0   Allergies  Allergen Reactions   Amoxicillin Diarrhea    Has patient had a PCN reaction causing immediate rash, facial/tongue/throat swelling, SOB or lightheadedness with hypotension: no Has patient had a PCN reaction causing severe rash involving mucus membranes or skin necrosis: no Has patient had a PCN reaction that required hospitalization: no Has patient had a PCN reaction occurring within the last 10 years: yes If all of the above answers are NO, then may proceed with Cephalosporin use.    Justicia Adhatoda Anaphylaxis   Other Anaphylaxis and Swelling    Tree nuts   Shellfish Protein-Containing Drug Products Anaphylaxis    GI upset-lobster and crab     Review of Systems  Constitutional: Negative.   Respiratory: Negative.    Cardiovascular: Negative.   Musculoskeletal: Negative.   Neurological: Negative.   Psychiatric/Behavioral: Negative.       Today's Vitals   09/10/24 1147  BP: 130/72  Pulse: 96  Temp: 98.6 F (37 C)  TempSrc: Oral  Weight: (!) 328 lb (148.8 kg)  Height: 5' 2 (1.575 m)  PainSc: 0-No pain   Body mass index is 59.99 kg/m.  Wt Readings from Last 3 Encounters:  09/10/24 (!) 328 lb (148.8 kg)  09/09/24 (!) 320 lb (145.2 kg)  08/13/24 (!) 321 lb (145.6 kg)      Objective:  Physical Exam Vitals and nursing  note reviewed.  Constitutional:      General: She is not in acute distress.    Appearance: Normal appearance. She is well-developed. She is obese.  Cardiovascular:     Rate and Rhythm: Normal rate and regular rhythm.     Pulses: Normal pulses.     Heart sounds: Normal heart sounds. No murmur heard. Pulmonary:     Effort: Pulmonary effort is normal. No respiratory distress.     Breath sounds: Normal breath sounds. No wheezing.  Chest:     Chest wall: No tenderness.  Musculoskeletal:        General: Normal range of motion.  Skin:    General: Skin is warm and dry.     Capillary Refill: Capillary refill takes less than 2 seconds.  Neurological:     General: No focal deficit present.     Mental Status: She is alert and oriented to person, place, and time.     Cranial Nerves: No cranial nerve deficit.     Coordination: Coordination normal.  Psychiatric:        Mood and Affect: Mood normal.        Behavior: Behavior normal.        Thought Content: Thought content normal.        Judgment: Judgment normal.     k Assessment And Plan:   Assessment & Plan Concentration deficit Vyvanse  aids focus. No childhood ADHD diagnosis. Current dose is maximum. - Continue Vyvanse  60 mg oral every morning. - Consider future specialist referral for attention and concentration evaluation. Binge eating disorder, unspecified severity Increased cravings for sweets. Weight at 321 lbs. Vyvanse  effective for cravings and concentration. - Continue Vyvanse  60 mg oral every morning. - Encouraged adherence to healthy weight management diet. - Encouraged daily walking for at least 15 minutes. - Refilled Vyvanse  prescription. Tetanus, diphtheria, and acellular pertussis (Tdap) vaccination declined  Class 3 severe obesity due to excess calories with body mass index (BMI) of 50.0 to 59.9 in adult, unspecified whether serious comorbidity present [facility]) She is encouraged to strive for BMI less than 30 to decrease  cardiac risk. Advised to aim for at least 150 minutes of exercise per week.  Obstructive sleep apnea with dependence on continuous positive airway pressure (CPAP) Inconsistent CPAP use leads to morning headaches.  No orders of the defined types were placed in this encounter.     Return for 2 months weight check.  Patient was given opportunity to ask questions. Patient verbalized understanding of the plan and was able to repeat key elements of the plan. All questions were answered to their satisfaction.   LILLETTE Meghan Ada, FNP, have reviewed all documentation for this visit. The documentation on 09/10/24 for the exam, diagnosis, procedures, and orders are all accurate and complete.   IF YOU HAVE BEEN REFERRED TO A SPECIALIST, IT MAY TAKE 1-2 WEEKS TO SCHEDULE/PROCESS THE REFERRAL. IF YOU HAVE NOT HEARD FROM US /SPECIALIST IN TWO WEEKS, PLEASE GIVE US  A CALL AT [phone].

## 2024-09-17 NOTE — Assessment & Plan Note (Signed)
 Prior fasting lipid panel done 5 months ago with elevated LDL and HDL below goal.  Will repeat fasting lipid panel today and plan to discuss labs at next appointment and risk stratify with new values.

## 2024-09-17 NOTE — Assessment & Plan Note (Signed)
 Patient is still wearing CPAP more frequently than not.  There is still room for improvement in terms of her compliance.  We discussed the importance of wearing CPAP nightly for both sleep quantity and quality.  Will follow-up at next appointment.

## 2024-09-17 NOTE — Assessment & Plan Note (Signed)
 Patient is being mindful on controlling simple carbohydrate intake on meal plan and with assistance of Vyvanse .  Repeat CMP, A1c, insulin  level today.

## 2024-09-22 NOTE — Assessment & Plan Note (Signed)
 Vyvanse  aids focus. No childhood ADHD diagnosis. Current dose is maximum. - Continue Vyvanse  60 mg oral every morning. - Consider future specialist referral for attention and concentration evaluation.

## 2024-09-22 NOTE — Assessment & Plan Note (Signed)
 Increased cravings for sweets. Weight at 321 lbs. Vyvanse  effective for cravings and concentration. - Continue Vyvanse  60 mg oral every morning. - Encouraged adherence to healthy weight management diet. - Encouraged daily walking for at least 15 minutes. - Refilled Vyvanse  prescription.

## 2024-09-22 NOTE — Assessment & Plan Note (Signed)
 Inconsistent CPAP use leads to morning headaches.

## 2024-09-22 NOTE — Assessment & Plan Note (Signed)
 She is encouraged to strive for BMI less than 30 to decrease cardiac risk. Advised to aim for at least 150 minutes of exercise per week.

## 2024-09-29 ENCOUNTER — Other Ambulatory Visit (HOSPITAL_COMMUNITY): Payer: Self-pay

## 2024-10-07 ENCOUNTER — Ambulatory Visit (INDEPENDENT_AMBULATORY_CARE_PROVIDER_SITE_OTHER): Admitting: Family Medicine

## 2024-10-26 ENCOUNTER — Encounter (INDEPENDENT_AMBULATORY_CARE_PROVIDER_SITE_OTHER): Payer: Self-pay | Admitting: Family Medicine

## 2024-11-04 ENCOUNTER — Ambulatory Visit (INDEPENDENT_AMBULATORY_CARE_PROVIDER_SITE_OTHER): Admitting: Family Medicine

## 2024-11-09 ENCOUNTER — Other Ambulatory Visit: Payer: Self-pay

## 2024-11-09 ENCOUNTER — Other Ambulatory Visit (HOSPITAL_COMMUNITY): Payer: Self-pay

## 2024-11-09 ENCOUNTER — Other Ambulatory Visit: Payer: Self-pay | Admitting: Nurse Practitioner

## 2024-11-09 MED ORDER — ROSUVASTATIN CALCIUM 10 MG PO TABS
10.0000 mg | ORAL_TABLET | Freq: Every day | ORAL | 1 refills | Status: AC
  Filled 2024-11-09: qty 90, 90d supply, fill #0

## 2024-11-12 ENCOUNTER — Other Ambulatory Visit (HOSPITAL_COMMUNITY): Payer: Self-pay

## 2024-11-30 ENCOUNTER — Ambulatory Visit (INDEPENDENT_AMBULATORY_CARE_PROVIDER_SITE_OTHER): Admitting: Family Medicine

## 2024-12-14 ENCOUNTER — Ambulatory Visit: Admitting: Nurse Practitioner
